# Patient Record
Sex: Female | Born: 1960 | Race: Black or African American | Hispanic: No | Smoking: Never smoker
Health system: Southern US, Community
[De-identification: ages and names within clinical notes are randomized; demographics above are authoritative.]

## PROBLEM LIST (undated history)

## (undated) DIAGNOSIS — I10 Essential (primary) hypertension: Secondary | ICD-10-CM

## (undated) DIAGNOSIS — M1990 Unspecified osteoarthritis, unspecified site: Secondary | ICD-10-CM

## (undated) HISTORY — PX: MENISCUS REPAIR: SHX5179

## (undated) HISTORY — PX: ABDOMINAL HYSTERECTOMY: SHX81

---

## 1997-09-18 ENCOUNTER — Other Ambulatory Visit: Admission: RE | Admit: 1997-09-18 | Discharge: 1997-09-18 | Payer: Self-pay | Admitting: Dermatology

## 1998-06-15 ENCOUNTER — Other Ambulatory Visit: Admission: RE | Admit: 1998-06-15 | Discharge: 1998-06-15 | Payer: Self-pay | Admitting: Obstetrics and Gynecology

## 1998-06-16 ENCOUNTER — Other Ambulatory Visit: Admission: RE | Admit: 1998-06-16 | Discharge: 1998-06-16 | Payer: Self-pay | Admitting: Obstetrics and Gynecology

## 2000-07-06 ENCOUNTER — Encounter: Payer: Self-pay | Admitting: Family Medicine

## 2000-07-06 ENCOUNTER — Encounter: Admission: RE | Admit: 2000-07-06 | Discharge: 2000-07-06 | Payer: Self-pay | Admitting: Family Medicine

## 2000-07-19 ENCOUNTER — Encounter: Admission: RE | Admit: 2000-07-19 | Discharge: 2000-07-19 | Payer: Self-pay | Admitting: Family Medicine

## 2000-07-19 ENCOUNTER — Encounter: Payer: Self-pay | Admitting: Family Medicine

## 2001-02-07 ENCOUNTER — Encounter: Payer: Self-pay | Admitting: Family Medicine

## 2001-02-07 ENCOUNTER — Encounter: Admission: RE | Admit: 2001-02-07 | Discharge: 2001-02-07 | Payer: Self-pay | Admitting: Family Medicine

## 2001-03-25 ENCOUNTER — Other Ambulatory Visit: Admission: RE | Admit: 2001-03-25 | Discharge: 2001-03-25 | Payer: Self-pay | Admitting: Family Medicine

## 2001-11-04 ENCOUNTER — Encounter: Payer: Self-pay | Admitting: Family Medicine

## 2001-11-04 ENCOUNTER — Encounter: Admission: RE | Admit: 2001-11-04 | Discharge: 2001-11-04 | Payer: Self-pay | Admitting: Family Medicine

## 2002-07-14 ENCOUNTER — Ambulatory Visit (HOSPITAL_COMMUNITY): Admission: RE | Admit: 2002-07-14 | Discharge: 2002-07-14 | Payer: Self-pay | Admitting: Family Medicine

## 2002-07-14 ENCOUNTER — Encounter: Payer: Self-pay | Admitting: Family Medicine

## 2002-11-24 ENCOUNTER — Encounter: Payer: Self-pay | Admitting: Family Medicine

## 2002-11-24 ENCOUNTER — Encounter: Admission: RE | Admit: 2002-11-24 | Discharge: 2002-11-24 | Payer: Self-pay | Admitting: Family Medicine

## 2004-02-04 ENCOUNTER — Encounter: Admission: RE | Admit: 2004-02-04 | Discharge: 2004-02-04 | Payer: Self-pay | Admitting: Family Medicine

## 2004-05-16 ENCOUNTER — Ambulatory Visit (HOSPITAL_COMMUNITY): Admission: RE | Admit: 2004-05-16 | Discharge: 2004-05-16 | Payer: Self-pay | Admitting: Family Medicine

## 2004-08-23 ENCOUNTER — Encounter: Admission: RE | Admit: 2004-08-23 | Discharge: 2004-08-23 | Payer: Self-pay | Admitting: Allergy and Immunology

## 2005-03-17 ENCOUNTER — Encounter: Admission: RE | Admit: 2005-03-17 | Discharge: 2005-03-17 | Payer: Self-pay | Admitting: Family Medicine

## 2006-09-27 ENCOUNTER — Encounter: Admission: RE | Admit: 2006-09-27 | Discharge: 2006-09-27 | Payer: Self-pay | Admitting: Family Medicine

## 2006-10-16 ENCOUNTER — Other Ambulatory Visit: Admission: RE | Admit: 2006-10-16 | Discharge: 2006-10-16 | Payer: Self-pay | Admitting: Family Medicine

## 2007-10-09 ENCOUNTER — Encounter: Admission: RE | Admit: 2007-10-09 | Discharge: 2007-10-09 | Payer: Self-pay | Admitting: Family Medicine

## 2008-05-03 ENCOUNTER — Emergency Department (HOSPITAL_COMMUNITY): Admission: EM | Admit: 2008-05-03 | Discharge: 2008-05-03 | Payer: Self-pay | Admitting: Emergency Medicine

## 2008-05-03 ENCOUNTER — Ambulatory Visit: Payer: Self-pay | Admitting: Vascular Surgery

## 2008-05-03 ENCOUNTER — Encounter (INDEPENDENT_AMBULATORY_CARE_PROVIDER_SITE_OTHER): Payer: Self-pay | Admitting: Emergency Medicine

## 2008-08-06 ENCOUNTER — Encounter: Admission: RE | Admit: 2008-08-06 | Discharge: 2008-08-06 | Payer: Self-pay | Admitting: Orthopedic Surgery

## 2009-01-05 ENCOUNTER — Encounter: Admission: RE | Admit: 2009-01-05 | Discharge: 2009-01-05 | Payer: Self-pay | Admitting: Family Medicine

## 2009-10-07 ENCOUNTER — Other Ambulatory Visit: Admission: RE | Admit: 2009-10-07 | Discharge: 2009-10-07 | Payer: Self-pay | Admitting: Family Medicine

## 2010-01-27 ENCOUNTER — Encounter
Admission: RE | Admit: 2010-01-27 | Discharge: 2010-01-27 | Payer: Self-pay | Source: Home / Self Care | Admitting: Family Medicine

## 2010-02-08 ENCOUNTER — Encounter
Admission: RE | Admit: 2010-02-08 | Discharge: 2010-02-08 | Payer: Self-pay | Source: Home / Self Care | Attending: Family Medicine | Admitting: Family Medicine

## 2010-03-05 IMAGING — CR DG SHOULDER 3+V*R*
2 series · 6 of 6 positions shown · non-contrast
Comparison: NONE

CLINICAL DATA: Attn. NODA, ANA JULIA  Pain. 

RIGHT SHOULDER ??? TWO VIEWS

[Series 1: view not recorded · 0.17mm/px · 2 of 2 slices shown (1 of 2)]
[im 1/2]
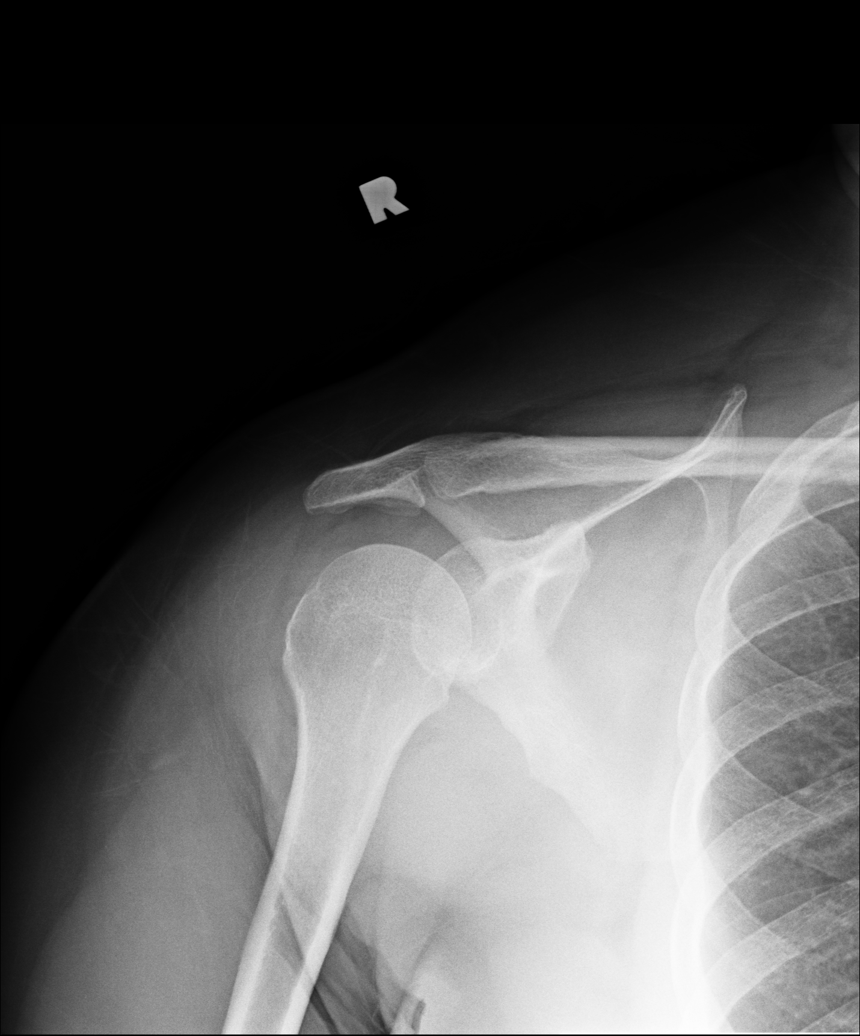
[im 2/2]
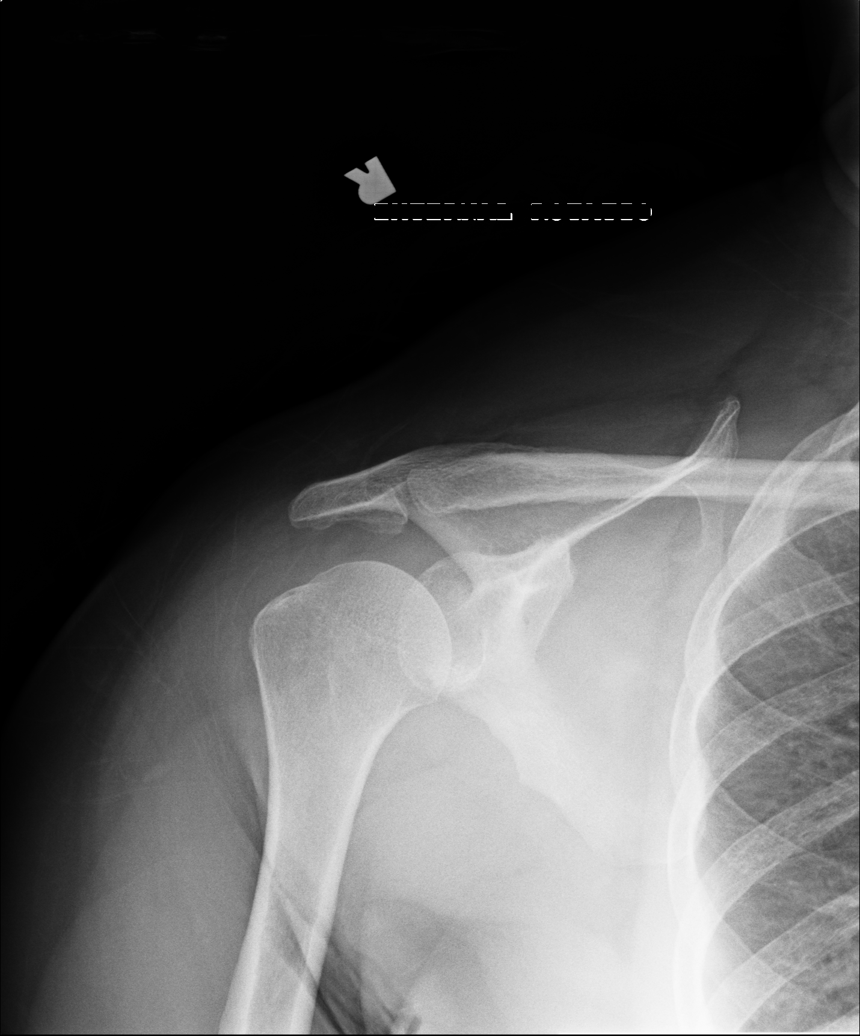

[Series 2: view not recorded · 0.17mm/px · 4 of 4 slices shown (2 of 2)]
[im 1/4]
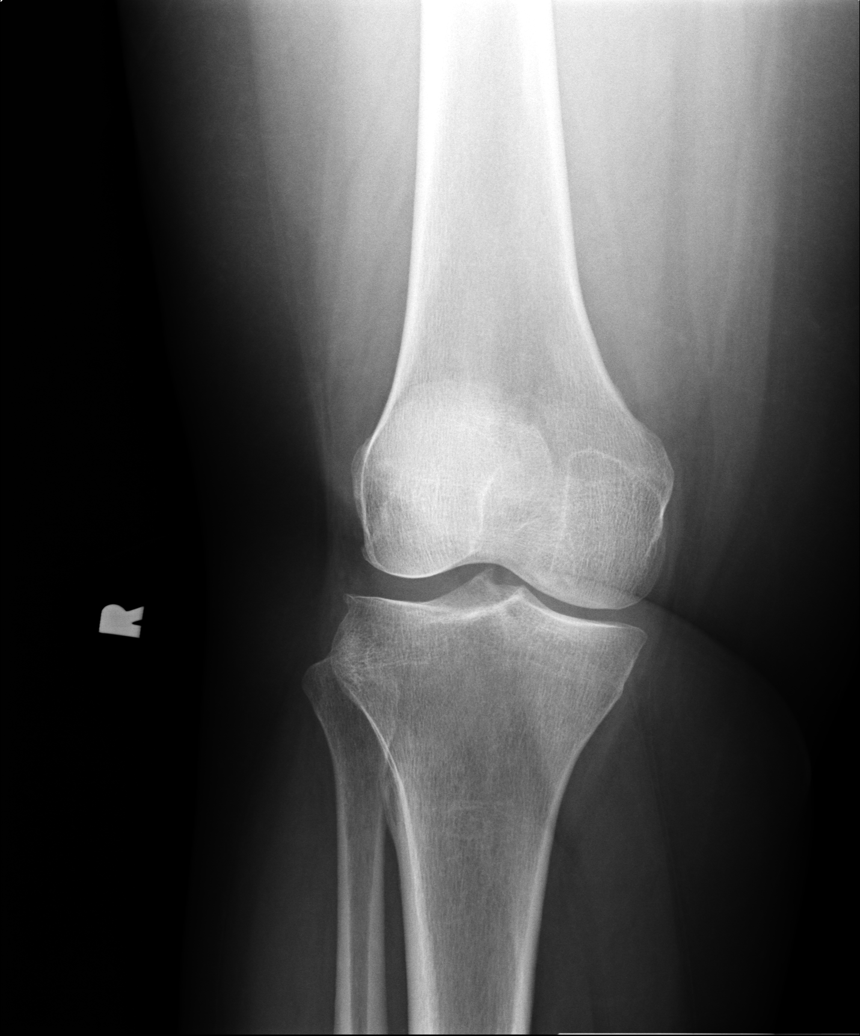
[im 2/4]
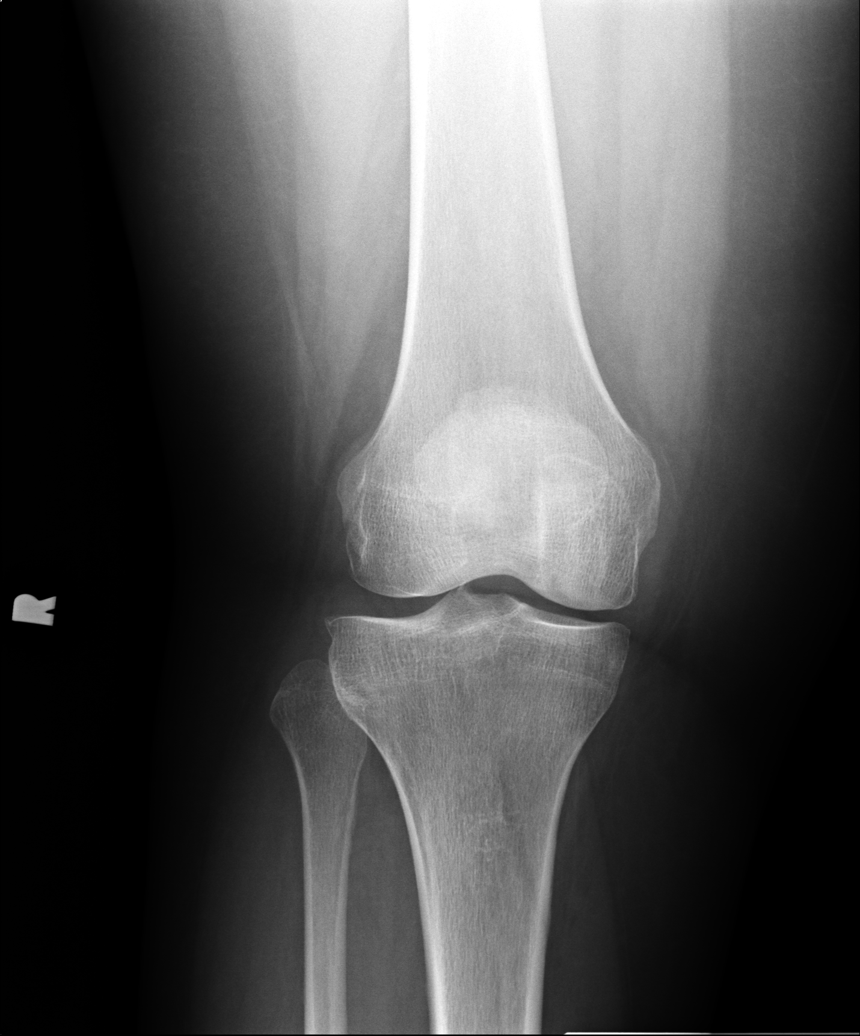
[im 3/4]
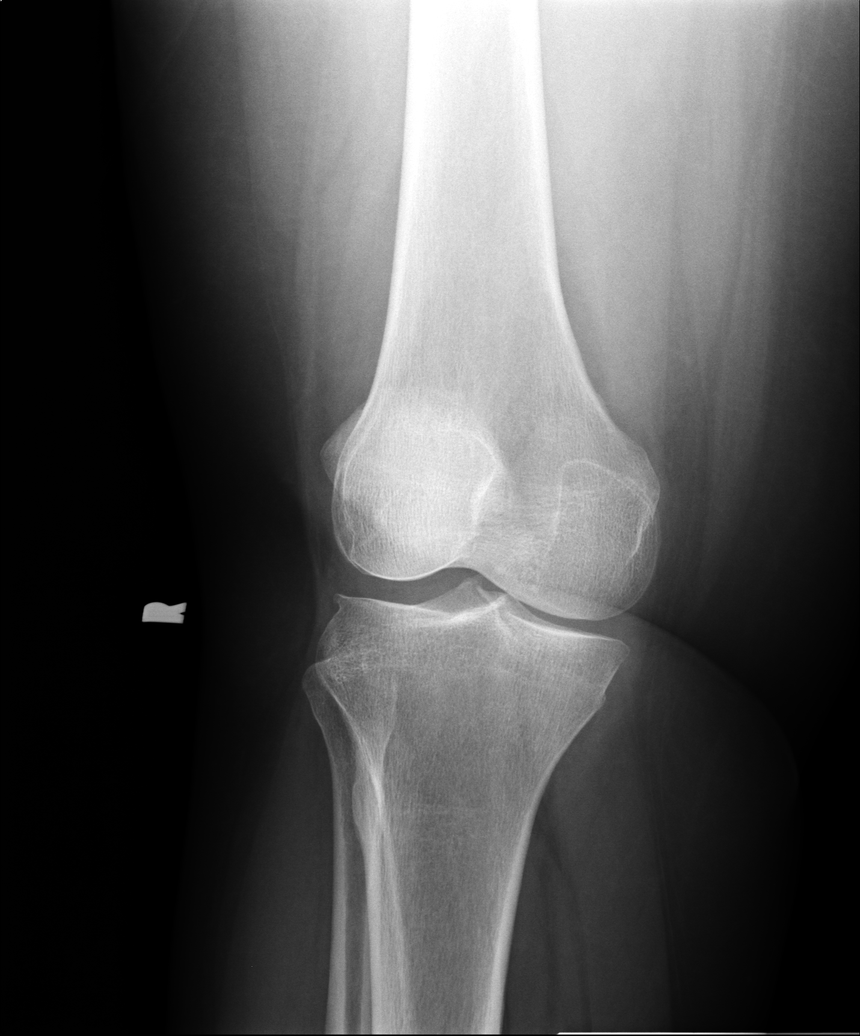
[im 4/4]
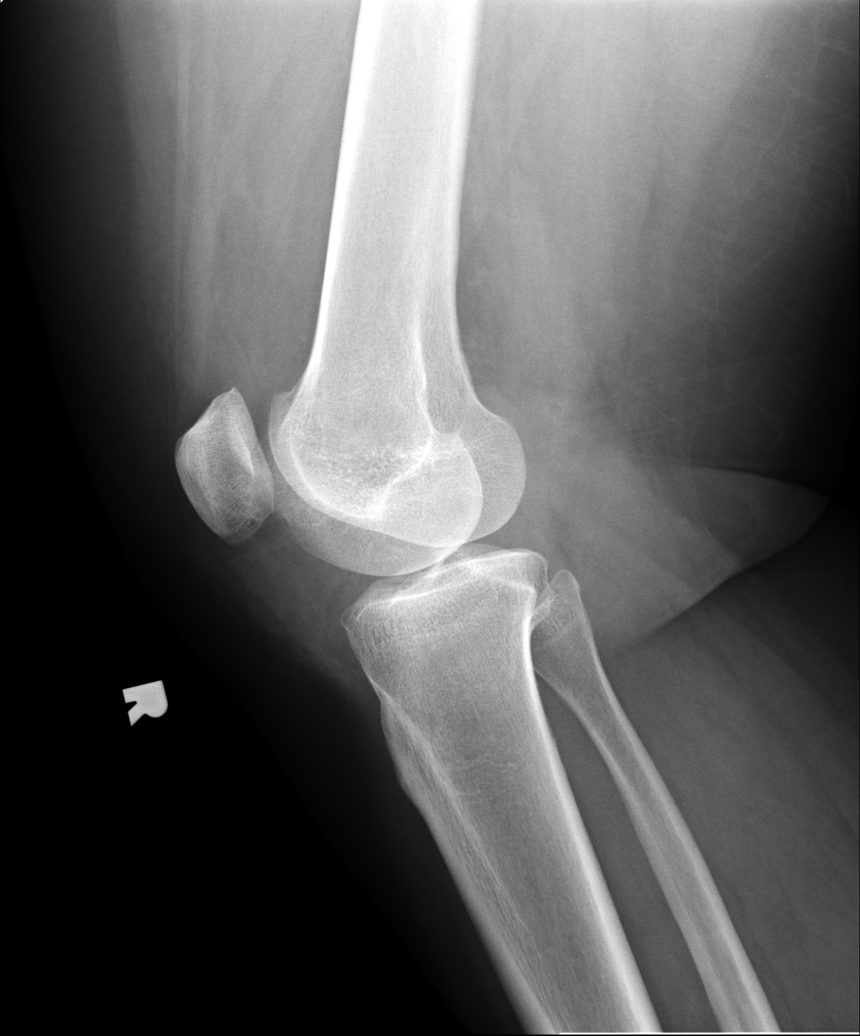

[6 of 6 positions shown; findings below may reference images not displayed]

FINDINGS: Views of the right shoulder demonstrate no evidence of 
fracture, dislocation, soft tissue abnormality, or changes 
suggesting erosive or degenerative arthritis. 

electronically reviewed on 05/13/2007 Dict Date: 05/13/2007  Tran 
Date:  05/13/2007 DAS  [REDACTED]

## 2011-01-10 ENCOUNTER — Other Ambulatory Visit: Payer: Self-pay | Admitting: Family Medicine

## 2011-01-10 DIAGNOSIS — Z1231 Encounter for screening mammogram for malignant neoplasm of breast: Secondary | ICD-10-CM

## 2011-02-10 ENCOUNTER — Ambulatory Visit
Admission: RE | Admit: 2011-02-10 | Discharge: 2011-02-10 | Disposition: A | Discharge status: Home / Self Care | Payer: BC Managed Care – PPO | Source: Ambulatory Visit | Attending: Family Medicine | Admitting: Family Medicine

## 2011-02-10 DIAGNOSIS — Z1231 Encounter for screening mammogram for malignant neoplasm of breast: Secondary | ICD-10-CM

## 2011-11-03 ENCOUNTER — Other Ambulatory Visit: Payer: Self-pay | Admitting: Occupational Medicine

## 2011-11-03 ENCOUNTER — Ambulatory Visit
Admission: RE | Admit: 2011-11-03 | Discharge: 2011-11-03 | Disposition: A | Discharge status: Home / Self Care | Payer: Self-pay | Source: Ambulatory Visit | Attending: Occupational Medicine | Admitting: Occupational Medicine

## 2011-11-03 DIAGNOSIS — W19XXXA Unspecified fall, initial encounter: Secondary | ICD-10-CM

## 2012-02-07 ENCOUNTER — Other Ambulatory Visit: Payer: Self-pay | Admitting: Family Medicine

## 2012-02-07 DIAGNOSIS — Z1231 Encounter for screening mammogram for malignant neoplasm of breast: Secondary | ICD-10-CM

## 2012-02-14 ENCOUNTER — Ambulatory Visit
Admission: RE | Admit: 2012-02-14 | Discharge: 2012-02-14 | Disposition: A | Discharge status: Home / Self Care | Payer: BC Managed Care – PPO | Source: Ambulatory Visit | Attending: Family Medicine | Admitting: Family Medicine

## 2012-02-14 DIAGNOSIS — Z1231 Encounter for screening mammogram for malignant neoplasm of breast: Secondary | ICD-10-CM

## 2012-07-30 ENCOUNTER — Ambulatory Visit: Payer: Self-pay

## 2012-07-30 ENCOUNTER — Other Ambulatory Visit: Payer: Self-pay | Admitting: Occupational Medicine

## 2012-07-30 DIAGNOSIS — R52 Pain, unspecified: Secondary | ICD-10-CM

## 2013-03-18 ENCOUNTER — Other Ambulatory Visit: Payer: Self-pay

## 2013-03-18 DIAGNOSIS — Z1231 Encounter for screening mammogram for malignant neoplasm of breast: Secondary | ICD-10-CM

## 2013-03-18 DIAGNOSIS — Z803 Family history of malignant neoplasm of breast: Secondary | ICD-10-CM

## 2013-03-19 ENCOUNTER — Ambulatory Visit
Admission: RE | Admit: 2013-03-19 | Discharge: 2013-03-19 | Disposition: A | Discharge status: Home / Self Care | Payer: BC Managed Care – PPO | Source: Ambulatory Visit

## 2013-03-19 DIAGNOSIS — Z803 Family history of malignant neoplasm of breast: Secondary | ICD-10-CM

## 2013-03-19 DIAGNOSIS — Z1231 Encounter for screening mammogram for malignant neoplasm of breast: Secondary | ICD-10-CM

## 2014-03-31 ENCOUNTER — Other Ambulatory Visit: Payer: Self-pay

## 2014-03-31 DIAGNOSIS — Z1231 Encounter for screening mammogram for malignant neoplasm of breast: Secondary | ICD-10-CM

## 2014-04-03 ENCOUNTER — Ambulatory Visit
Admission: RE | Admit: 2014-04-03 | Discharge: 2014-04-03 | Disposition: A | Discharge status: Home / Self Care | Payer: BC Managed Care – PPO | Source: Ambulatory Visit

## 2014-04-03 DIAGNOSIS — Z1231 Encounter for screening mammogram for malignant neoplasm of breast: Secondary | ICD-10-CM

## 2015-04-08 ENCOUNTER — Other Ambulatory Visit: Payer: Self-pay

## 2015-04-08 DIAGNOSIS — Z1231 Encounter for screening mammogram for malignant neoplasm of breast: Secondary | ICD-10-CM

## 2015-04-26 ENCOUNTER — Ambulatory Visit
Admission: RE | Admit: 2015-04-26 | Discharge: 2015-04-26 | Disposition: A | Discharge status: Home / Self Care | Payer: BC Managed Care – PPO | Source: Ambulatory Visit

## 2015-04-26 DIAGNOSIS — Z1231 Encounter for screening mammogram for malignant neoplasm of breast: Secondary | ICD-10-CM

## 2016-06-22 ENCOUNTER — Other Ambulatory Visit: Payer: Self-pay | Admitting: Family Medicine

## 2016-06-22 DIAGNOSIS — Z1231 Encounter for screening mammogram for malignant neoplasm of breast: Secondary | ICD-10-CM

## 2016-07-12 ENCOUNTER — Ambulatory Visit: Payer: Self-pay

## 2016-07-21 ENCOUNTER — Ambulatory Visit
Admission: RE | Admit: 2016-07-21 | Discharge: 2016-07-21 | Disposition: A | Discharge status: Home / Self Care | Payer: BC Managed Care – PPO | Source: Ambulatory Visit | Attending: Family Medicine | Admitting: Family Medicine

## 2016-07-21 DIAGNOSIS — Z1231 Encounter for screening mammogram for malignant neoplasm of breast: Secondary | ICD-10-CM

## 2017-08-31 ENCOUNTER — Other Ambulatory Visit: Payer: Self-pay | Admitting: Family Medicine

## 2017-12-06 ENCOUNTER — Other Ambulatory Visit: Payer: Self-pay | Admitting: Family Medicine

## 2017-12-06 DIAGNOSIS — Z1231 Encounter for screening mammogram for malignant neoplasm of breast: Secondary | ICD-10-CM

## 2018-01-11 ENCOUNTER — Ambulatory Visit
Admission: RE | Admit: 2018-01-11 | Discharge: 2018-01-11 | Disposition: A | Discharge status: Home / Self Care | Payer: BC Managed Care – PPO | Source: Ambulatory Visit | Attending: Family Medicine | Admitting: Family Medicine

## 2018-01-11 DIAGNOSIS — Z1231 Encounter for screening mammogram for malignant neoplasm of breast: Secondary | ICD-10-CM

## 2018-12-18 ENCOUNTER — Other Ambulatory Visit: Payer: Self-pay | Admitting: Family Medicine

## 2018-12-18 DIAGNOSIS — Z1231 Encounter for screening mammogram for malignant neoplasm of breast: Secondary | ICD-10-CM

## 2019-02-05 ENCOUNTER — Other Ambulatory Visit: Payer: Self-pay

## 2019-02-05 ENCOUNTER — Ambulatory Visit
Admission: RE | Admit: 2019-02-05 | Discharge: 2019-02-05 | Disposition: A | Discharge status: Home / Self Care | Payer: BC Managed Care – PPO | Source: Ambulatory Visit | Attending: Family Medicine | Admitting: Family Medicine

## 2019-02-05 DIAGNOSIS — Z1231 Encounter for screening mammogram for malignant neoplasm of breast: Secondary | ICD-10-CM

## 2019-04-13 ENCOUNTER — Ambulatory Visit: Payer: BC Managed Care – PPO

## 2019-04-24 ENCOUNTER — Ambulatory Visit: Payer: BC Managed Care – PPO | Attending: Internal Medicine

## 2019-04-24 DIAGNOSIS — Z23 Encounter for immunization: Secondary | ICD-10-CM | POA: Insufficient documentation

## 2019-04-24 NOTE — Progress Notes (Signed)
   Covid-19 Vaccination Clinic  Name:  Crystal Ramirez    MRN: [id number] DOB: April 05, 1960  04/24/2019  [name] was observed post Covid-19 immunization for 15 minutes without incidence. She was provided with Vaccine Information Sheet and instruction to access the V-Safe system.   [name] was instructed to call 911 with any severe reactions post vaccine: [name] Kitchen Difficulty breathing  . Swelling of your face and throat  . A fast heartbeat  . A bad rash all over your body  . Dizziness and weakness    Immunizations Administered    Name Date Dose VIS Date Route   Pfizer COVID-19 Vaccine 04/24/2019 12:24 PM 0.3 mL 02/07/2019 Intramuscular   Manufacturer: [employer], [employer]   Lot: [id number]   NDC: [address]-9

## 2019-05-02 ENCOUNTER — Ambulatory Visit
Admission: RE | Admit: 2019-05-02 | Discharge: 2019-05-02 | Disposition: A | Discharge status: Home / Self Care | Payer: BC Managed Care – PPO | Source: Ambulatory Visit | Attending: Family Medicine | Admitting: Family Medicine

## 2019-05-02 ENCOUNTER — Other Ambulatory Visit: Payer: Self-pay | Admitting: Family Medicine

## 2019-05-02 ENCOUNTER — Other Ambulatory Visit: Payer: Self-pay

## 2019-05-02 DIAGNOSIS — M25552 Pain in left hip: Secondary | ICD-10-CM

## 2019-05-20 ENCOUNTER — Ambulatory Visit: Payer: BC Managed Care – PPO | Attending: Internal Medicine

## 2019-05-20 DIAGNOSIS — Z23 Encounter for immunization: Secondary | ICD-10-CM

## 2019-05-20 NOTE — Progress Notes (Signed)
   Covid-19 Vaccination Clinic  Name:  Crystal Ramirez    MRN: [id number] DOB: 12-17-1960  05/20/2019  [name] was observed post Covid-19 immunization for 15 minutes without incident. She was provided with Vaccine Information Sheet and instruction to access the V-Safe system.   [name] was instructed to call 911 with any severe reactions post vaccine: [name] Kitchen Difficulty breathing  . Swelling of face and throat  . A fast heartbeat  . A bad rash all over body  . Dizziness and weakness   Immunizations Administered    Name Date Dose VIS Date Route   Pfizer COVID-19 Vaccine 05/20/2019 12:13 PM 0.3 mL 02/07/2019 Intramuscular   Manufacturer: [employer], [employer]   Lot: [id number]   NDC: [address]-2

## 2020-05-28 NOTE — H&P (Signed)
HIP ARTHROPLASTY ADMISSION H&P  Patient ID: Le Ferraz MRN: [id number] DOB/AGE: [birth year] 61 y.o.  Chief Complaint: left hip pain.  Planned Procedure Date: 06-08-20 Medical Clearance by Roslynn Amble PA-C     HPI: Crystal Ramirez is a 60 y.o. female who presents for evaluation of djd left hip. The patient has a history of pain and functional disability in the left hip due to arthritis and has failed non-surgical conservative treatments for greater than 12 weeks to include NSAID's and/or analgesics, corticosteriod injections, use of assistive devices, weight reduction as appropriate and activity modification.  Onset of symptoms was gradual, starting 1 years ago with rapidlly worsening course since that time. The patient noted no past surgery on the left hip.  Patient currently rates pain at 6 out of 10 with activity. Patient has night pain, worsening of pain with activity and weight bearing and pain that interferes with activities of daily living.  Patient has evidence of subchondral sclerosis and joint space narrowing by imaging studies.  There is no active infection.  No past medical history on file. No past surgical history on file. Allergies  Allergen Reactions  . Latex Shortness Of Breath  . Ibuprofen     High strengths cause acid reflux  . Morphine And Related     whelps    Prior to Admission medications   Medication Sig Start Date End Date Taking? Authorizing Provider  amLODipine (NORVASC) 5 MG tablet Take 5 mg by mouth daily.   Yes [provider]  aspirin EC 81 MG tablet Take 81 mg by mouth once a week. Swallow whole.   Yes [provider]  cetirizine (ZYRTEC) 10 MG tablet Take 10 mg by mouth daily as needed for allergies.   Yes [provider]  fluticasone (FLONASE) 50 MCG/ACT nasal spray Place 1 spray into both nostrils daily as needed for allergies. 05/07/20  Yes [provider]  gabapentin (NEURONTIN) 100 MG  capsule Take 100 mg by mouth daily as needed (pain).   Yes [provider]  ketotifen (ALLERGY EYE DROPS) 0.025 % ophthalmic solution Place 1 drop into both eyes daily.   Yes [provider]  losartan-hydrochlorothiazide (HYZAAR) 100-25 MG tablet Take 1 tablet by mouth daily.   Yes [provider]  meloxicam (MOBIC) 15 MG tablet Take 15 mg by mouth daily.   Yes [provider]  metoprolol succinate (TOPROL-XL) 100 MG 24 hr tablet Take 100 mg by mouth daily. Take with or immediately following a meal.   Yes [provider]  Multiple Vitamin (MULTIVITAMIN WITH MINERALS) TABS tablet Take 1 tablet by mouth daily.   Yes [provider]  phentermine 15 MG capsule Take 15 mg by mouth daily.   Yes [provider]  potassium chloride (KLOR-CON) 10 MEQ tablet Take 10 mEq by mouth daily.   Yes [provider]   Social History   Socioeconomic History  . Marital status: Legally Separated    Spouse name: Not on file  . Number of children: Not on file  . Years of education: Not on file  . Highest education level: Not on file  Occupational History  . Not on file  Tobacco Use  . Smoking status: Not on file  . Smokeless tobacco: Not on file  Substance and Sexual Activity  . Alcohol use: Not on file  . Drug use: Not on file  . Sexual activity: Not on file  Other Topics Concern  . Not on file  Social History Narrative  . Not on file   Social Determinants of Health   Financial Resource Strain: Not on file  Food Insecurity: Not on file  Transportation Needs: Not on file  Physical Activity: Not on file  Stress: Not on file  Social Connections: Not on file   No family history on file.  ROS: Currently denies lightheadedness, dizziness, Fever, chills, CP, SOB.   No personal history of DVT, PE, MI, or CVA. No loose teeth or dentures All other systems have been reviewed and were otherwise currently negative with the exception of  those mentioned in the HPI and as above.  Objective: Vitals: Ht: 5'6" Wt: 240.4 lbs Temp: 97.9 BP: 129/80 Pulse: 56 O2 99% on room air.   Physical Exam: General: Alert, NAD. Trendelenberg Gait. Very slow ambulation. Uses a cane HEENT: EOMI, Good Neck Extension  Pulm: No increased work of breathing.  Clear B/L A/P w/o crackle or wheeze.  CV: RRR, No m/g/r appreciated  GI: soft, NT, ND. BS in all 4 quadrants Neuro: CN II-XII grossly intact without focal deficit.  Sensation intact distally Skin: No lesions in the area of chief complaint MSK/Surgical Site: left Hip pain with passive ROM. + Stinchfield. + FABER. + FADIR. Decreased strength.  NVI.    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   The bone quality appears to be fair for age and reported activity level.  Preoperative templating of the joint replacement has been completed, documented, and submitted to the Operating Room personnel in order to optimize intra-operative equipment management.  Assessment: djd left hip Active Problems:   * No active hospital problems. *   Plan: Plan for Procedure(s): TOTAL HIP ARTHROPLASTY  The patient history, physical exam, clinical judgement of the provider and imaging are consistent with end stage degenerative joint disease and total joint arthroplasty is deemed medically necessary. The treatment options including medical management, injection therapy, and arthroplasty were discussed at length. The risks and benefits of Procedure(s): TOTAL HIP ARTHROPLASTY were presented and reviewed.  The risks of nonoperative treatment, versus surgical intervention including but not limited to continued pain, aseptic loosening, stiffness, dislocation/subluxation, infection, bleeding, nerve injury, blood clots, cardiopulmonary complications, morbidity, mortality, among others were discussed. The patient verbalizes understanding and wishes to proceed with the  plan.  Patient is being admitted for surgery, pain control, PT, prophylactic antibiotics, VTE prophylaxis, progressive ambulation, ADL's and discharge planning.   Dental prophylaxis discussed and recommended for 2 years postoperatively.   The patient does meet the criteria for TXA which will be used perioperatively.    ASA 81 mg BID will be used postoperatively for DVT prophylaxis in addition to SCDs, and early ambulation.  Plan for Norco, Mobic, Tylenol for pain. She would like to avoid Oxycodone if possible.    Robaxin for muscle spasms.   Zofran for nausea and vomiting.  Omeprazole for gastric protection.  The patient is planning to be discharged home with OPPT into the care of her daughter Enrique Sack who can be reached at [phone].   Follow up appt 06-16-20 at 2:45pm  Lab work done.  Needs a rolling walker if not able to get one on her own.    Jenne Pane, [place] Office [phone] 05/28/2020 4:21 PM

## 2020-06-01 NOTE — Patient Instructions (Addendum)
DUE TO COVID-19 ONLY ONE VISITOR IS ALLOWED TO COME WITH YOU AND STAY IN THE WAITING ROOM ONLY DURING PRE OP AND PROCEDURE DAY OF SURGERY. THE 1 VISITOR  MAY VISIT WITH YOU AFTER SURGERY IN YOUR PRIVATE ROOM DURING VISITING HOURS ONLY!  YOU NEED TO HAVE A COVID 19 TEST ON: 06/04/20 @ 12:00 PM , THIS TEST MUST BE DONE BEFORE SURGERY,  COVID TESTING SITE [address] [place] [address], IT IS ON THE RIGHT GOING OUT [address] APPROXIMATELY  2 MINUTES PAST [employer] ON THE RIGHT. ONCE YOUR COVID TEST IS COMPLETED,  PLEASE BEGIN THE QUARANTINE INSTRUCTIONS AS OUTLINED IN YOUR HANDOUT.                Crystal Ramirez    Your procedure is scheduled on: 06/08/20   Report to [facility] Main  Entrance   Report to admitting at: 10:10 AM     Call this number if you have problems the morning of surgery [phone]    Remember:   NO SOLID FOOD AFTER MIDNIGHT THE NIGHT PRIOR TO SURGERY. NOTHING BY MOUTH EXCEPT CLEAR LIQUIDS UNTIL: 9:40AM . PLEASE FINISH ENSURE DRINK PER SURGEON ORDER  WHICH NEEDS TO BE COMPLETED AT: 9:40 AM .  CLEAR LIQUID DIET  Foods Allowed                                                                     Foods Excluded  Coffee and tea, regular and decaf                             liquids that you cannot  Plain Jell-O any favor except red or purple                                           see through such as: Fruit ices (not with fruit pulp)                                     milk, soups, orange juice  Iced Popsicles                                    All solid food Carbonated beverages, regular and diet                                    Cranberry, grape and apple juices Sports drinks like Gatorade Lightly seasoned clear broth or consume(fat free) Sugar, honey syrup  Sample Menu Breakfast                                Lunch  Supper Cranberry juice                    Beef broth                             Chicken broth Jell-O                                     Grape juice                           Apple juice Coffee or tea                        Jell-O                                      Popsicle                                                Coffee or tea                        Coffee or tea  _____________________________________________________________________   BRUSH YOUR TEETH MORNING OF SURGERY AND RINSE YOUR MOUTH OUT, NO CHEWING GUM CANDY OR MINTS.    Take these medicines the morning of surgery with A SIP OF WATER: amlodipine,metoprolol.                               You may not have any metal on your body including hair pins and              piercings  Do not wear jewelry, make-up, lotions, powders or perfumes, deodorant             Do not wear nail polish on your fingernails.  Do not shave  48 hours prior to surgery.    Do not bring valuables to the hospital.  IS NOT             RESPONSIBLE   FOR VALUABLES.  Contacts, dentures or bridgework may not be worn into surgery.  Leave suitcase in the car. After surgery it may be brought to your room.     Patients discharged the day of surgery will not be allowed to drive home. IF YOU ARE HAVING SURGERY AND GOING HOME THE SAME DAY, YOU MUST HAVE AN ADULT TO DRIVE YOU HOME AND BE WITH YOU FOR 24 HOURS. YOU MAY GO HOME BY TAXI OR UBER OR ORTHERWISE, BUT AN ADULT MUST ACCOMPANY YOU HOME AND STAY WITH YOU FOR 24 HOURS.  Name and phone number of your driver:  Special Instructions: N/A              Please read over the following fact sheets you were given: _____________________________________________________________________         [facility] - Preparing for Surgery Before surgery, you can play an important role.  Because skin is not sterile, your skin needs to be as free of germs as possible.  You can  reduce the number of germs on your skin by washing with CHG (chlorahexidine gluconate) soap before surgery.  CHG  is an antiseptic cleaner which kills germs and bonds with the skin to continue killing germs even after washing. Please DO NOT use if you have an allergy to CHG or antibacterial soaps.  If your skin becomes reddened/irritated stop using the CHG and inform your nurse when you arrive at Short Stay. Do not shave (including legs and underarms) for at least 48 hours prior to the first CHG shower.  You may shave your face/neck. Please follow these instructions carefully:  1.  Shower with CHG Soap the night before surgery and the  morning of Surgery.  2.  If you choose to wash your hair, wash your hair first as usual with your  normal  shampoo.  3.  After you shampoo, rinse your hair and body thoroughly to remove the  shampoo.                           4.  Use CHG as you would any other liquid soap.  You can apply chg directly  to the skin and wash                       Gently with a scrungie or clean washcloth.  5.  Apply the CHG Soap to your body ONLY FROM THE NECK DOWN.   Do not use on face/ open                           Wound or open sores. Avoid contact with eyes, ears mouth and genitals (private parts).                       Wash face,  Genitals (private parts) with your normal soap.             6.  Wash thoroughly, paying special attention to the area where your surgery  will be performed.  7.  Thoroughly rinse your body with warm water from the neck down.  8.  DO NOT shower/wash with your normal soap after using and rinsing off  the CHG Soap.                9.  Pat yourself dry with a clean towel.            10.  Wear clean pajamas.            11.  Place clean sheets on your bed the night of your first shower and do not  sleep with pets. Day of Surgery : Do not apply any lotions/deodorants the morning of surgery.  Please wear clean clothes to the hospital/surgery center.  FAILURE TO FOLLOW THESE INSTRUCTIONS MAY RESULT IN THE CANCELLATION OF YOUR SURGERY PATIENT  SIGNATURE_________________________________  NURSE SIGNATURE__________________________________  ________________________________________________________________________   [name]  An incentive spirometer is a tool that can help keep your lungs clear and active. This tool measures how well you are filling your lungs with each breath. Taking long deep breaths may help reverse or decrease the chance of developing breathing (pulmonary) problems (especially infection) following:  A long period of time when you are unable to move or be active. BEFORE THE PROCEDURE   If the spirometer includes an indicator to show your best effort, your nurse or respiratory therapist will set it  to a desired goal.  If possible, sit up straight or lean slightly forward. Try not to slouch.  Hold the incentive spirometer in an upright position. INSTRUCTIONS FOR USE  1. Sit on the edge of your bed if possible, or sit up as far as you can in bed or on a chair. 2. Hold the incentive spirometer in an upright position. 3. Breathe out normally. 4. Place the mouthpiece in your mouth and seal your lips tightly around it. 5. Breathe in slowly and as deeply as possible, raising the piston or the ball toward the top of the column. 6. Hold your breath for 3-5 seconds or for as long as possible. Allow the piston or ball to fall to the bottom of the column. 7. Remove the mouthpiece from your mouth and breathe out normally. 8. Rest for a few seconds and repeat Steps 1 through 7 at least 10 times every 1-2 hours when you are awake. Take your time and take a few normal breaths between deep breaths. 9. The spirometer may include an indicator to show your best effort. Use the indicator as a goal to work toward during each repetition. 10. After each set of 10 deep breaths, practice coughing to be sure your lungs are clear. If you have an incision (the cut made at the time of surgery), support your incision when coughing  by placing a pillow or rolled up towels firmly against it. Once you are able to get out of bed, walk around indoors and cough well. You may stop using the incentive spirometer when instructed by your caregiver.  RISKS AND COMPLICATIONS  Take your time so you do not get dizzy or light-headed.  If you are in pain, you may need to take or ask for pain medication before doing incentive spirometry. It is harder to take a deep breath if you are having pain. AFTER USE  Rest and breathe slowly and easily.  It can be helpful to keep track of a log of your progress. Your caregiver can provide you with a simple table to help with this. If you are using the spirometer at home, follow these instructions: [place] IF:   You are having difficultly using the spirometer.  You have trouble using the spirometer as often as instructed.  Your pain medication is not giving enough relief while using the spirometer.  You develop fever of 100.5 F (38.1 C) or higher. SEEK IMMEDIATE MEDICAL CARE IF:   You cough up bloody sputum that had not been present before.  You develop fever of 102 F (38.9 C) or greater.  You develop worsening pain at or near the incision site. MAKE SURE YOU:   Understand these instructions.  Will watch your condition.  Will get help right away if you are not doing well or get worse. Document Released: 06/26/2006 Document Revised: 05/08/2011 Document Reviewed: 08/27/2006 [facility] Patient Information 2014 [place], [place].   ________________________________________________________________________

## 2020-06-02 ENCOUNTER — Encounter (HOSPITAL_COMMUNITY)
Admission: RE | Admit: 2020-06-02 | Discharge: 2020-06-02 | Disposition: A | Discharge status: Home / Self Care | Payer: BC Managed Care – PPO | Source: Ambulatory Visit | Attending: Orthopedic Surgery | Admitting: Orthopedic Surgery

## 2020-06-02 ENCOUNTER — Encounter (HOSPITAL_COMMUNITY): Payer: Self-pay

## 2020-06-02 ENCOUNTER — Other Ambulatory Visit: Payer: Self-pay

## 2020-06-02 DIAGNOSIS — Z01818 Encounter for other preprocedural examination: Secondary | ICD-10-CM | POA: Diagnosis not present

## 2020-06-02 HISTORY — DX: Unspecified osteoarthritis, unspecified site: M19.90

## 2020-06-02 HISTORY — DX: Essential (primary) hypertension: I10

## 2020-06-02 LAB — CBC
HCT: 36.9 % (ref 36.0–46.0)
Hemoglobin: 12.1 g/dL (ref 12.0–15.0)
MCH: 31.5 pg (ref 26.0–34.0)
MCHC: 32.8 g/dL (ref 30.0–36.0)
MCV: 96.1 fL (ref 80.0–100.0)
Platelets: 445 10*3/uL — ABNORMAL HIGH (ref 150–400)
RBC: 3.84 MIL/uL — ABNORMAL LOW (ref 3.87–5.11)
RDW: 12.7 % (ref 11.5–15.5)
WBC: 6.6 10*3/uL (ref 4.0–10.5)
nRBC: 0 % (ref 0.0–0.2)

## 2020-06-02 LAB — BASIC METABOLIC PANEL
Anion gap: 9 (ref 5–15)
BUN: 17 mg/dL (ref 8–23)
CO2: 27 mmol/L (ref 22–32)
Calcium: 9.3 mg/dL (ref 8.9–10.3)
Chloride: 103 mmol/L (ref 98–111)
Creatinine, Ser: 0.63 mg/dL (ref 0.44–1.00)
GFR, Estimated: >60 mL/min (ref >60)
Glucose, Bld: 98 mg/dL (ref 70–99)
Potassium: 3.3 mmol/L — ABNORMAL LOW (ref 3.5–5.1)
Sodium: 139 mmol/L (ref 135–145)

## 2020-06-02 LAB — SURGICAL PCR SCREEN
MRSA, PCR: NEGATIVE
Staphylococcus aureus: NEGATIVE

## 2020-06-02 NOTE — Progress Notes (Signed)
COVID Vaccine Completed: Yes Date COVID Vaccine completed: 05/20/19 COVID vaccine manufacturer: Pfizer      PCP - Dr. Severiano Gilbert Cardiologist -   Chest x-ray -  EKG -  Stress Test -  ECHO -  Cardiac Cath -  Pacemaker/ICD device last checked:  Sleep Study -  CPAP -   Fasting Blood Sugar -  Checks Blood Sugar _____ times a day  Blood Thinner Instructions: Aspirin Instructions: Last Dose:  Anesthesia review:   Patient denies shortness of breath, fever, cough and chest pain at PAT appointment   Patient verbalized understanding of instructions that were given to them at the PAT appointment. Patient was also instructed that they will need to review over the PAT instructions again at home before surgery.

## 2020-06-04 ENCOUNTER — Other Ambulatory Visit (HOSPITAL_COMMUNITY)
Admission: RE | Admit: 2020-06-04 | Discharge: 2020-06-04 | Disposition: A | Discharge status: Home / Self Care | Payer: BC Managed Care – PPO | Source: Ambulatory Visit | Attending: Orthopedic Surgery | Admitting: Orthopedic Surgery

## 2020-06-04 DIAGNOSIS — Z01812 Encounter for preprocedural laboratory examination: Secondary | ICD-10-CM | POA: Insufficient documentation

## 2020-06-04 DIAGNOSIS — Z20822 Contact with and (suspected) exposure to covid-19: Secondary | ICD-10-CM | POA: Insufficient documentation

## 2020-06-04 LAB — SARS CORONAVIRUS 2 (TAT 6-24 HRS): SARS Coronavirus 2: NEGATIVE

## 2020-06-08 ENCOUNTER — Ambulatory Visit (HOSPITAL_COMMUNITY): Payer: BC Managed Care – PPO | Admitting: Anesthesiology

## 2020-06-08 ENCOUNTER — Observation Stay (HOSPITAL_COMMUNITY)
Admission: RE | Admit: 2020-06-08 | Discharge: 2020-06-09 | Disposition: A | Discharge status: Home / Self Care | Payer: BC Managed Care – PPO | Source: Ambulatory Visit | Attending: Orthopedic Surgery | Admitting: Orthopedic Surgery

## 2020-06-08 ENCOUNTER — Encounter (HOSPITAL_COMMUNITY): Payer: Self-pay | Admitting: Orthopedic Surgery

## 2020-06-08 ENCOUNTER — Ambulatory Visit (HOSPITAL_COMMUNITY): Payer: BC Managed Care – PPO

## 2020-06-08 ENCOUNTER — Other Ambulatory Visit: Payer: Self-pay

## 2020-06-08 ENCOUNTER — Encounter (HOSPITAL_COMMUNITY)
Admission: RE | Disposition: A | Discharge status: Home / Self Care | Payer: Self-pay | Source: Ambulatory Visit | Attending: Orthopedic Surgery

## 2020-06-08 DIAGNOSIS — M25552 Pain in left hip: Secondary | ICD-10-CM | POA: Diagnosis present

## 2020-06-08 DIAGNOSIS — Z96642 Presence of left artificial hip joint: Secondary | ICD-10-CM | POA: Diagnosis present

## 2020-06-08 DIAGNOSIS — Z91040 Latex allergy status: Secondary | ICD-10-CM | POA: Insufficient documentation

## 2020-06-08 DIAGNOSIS — Z791 Long term (current) use of non-steroidal anti-inflammatories (NSAID): Secondary | ICD-10-CM | POA: Diagnosis not present

## 2020-06-08 DIAGNOSIS — Z419 Encounter for procedure for purposes other than remedying health state, unspecified: Secondary | ICD-10-CM

## 2020-06-08 DIAGNOSIS — M1612 Unilateral primary osteoarthritis, left hip: Principal | ICD-10-CM | POA: Insufficient documentation

## 2020-06-08 DIAGNOSIS — Z7952 Long term (current) use of systemic steroids: Secondary | ICD-10-CM | POA: Insufficient documentation

## 2020-06-08 HISTORY — PX: TOTAL HIP ARTHROPLASTY: SHX124

## 2020-06-08 SURGERY — ARTHROPLASTY, HIP, TOTAL, ANTERIOR APPROACH
Anesthesia: Spinal | Site: Hip | Laterality: Left

## 2020-06-08 MED ORDER — OXYCODONE HCL 5 MG PO TABS
5.0000 mg | ORAL_TABLET | ORAL | Status: DC | PRN
  Administered 2020-06-09: 10 mg via ORAL
  Filled 2020-06-08 (×3): qty 2

## 2020-06-08 MED ORDER — ONDANSETRON HCL 4 MG PO TABS: 4.0000 mg | ORAL_TABLET | Freq: Four times a day (QID) | ORAL | Status: DC | PRN

## 2020-06-08 MED ORDER — PROPOFOL 500 MG/50ML IV EMUL
INTRAVENOUS | Status: AC
  Filled 2020-06-08: qty 50

## 2020-06-08 MED ORDER — CEFAZOLIN SODIUM-DEXTROSE 2-4 GM/100ML-% IV SOLN
2.0000 g | Freq: Four times a day (QID) | INTRAVENOUS | Status: AC
  Administered 2020-06-08 – 2020-06-09 (×2): 2 g via INTRAVENOUS
  Filled 2020-06-08 (×2): qty 100

## 2020-06-08 MED ORDER — LACTATED RINGERS IV SOLN: INTRAVENOUS | Status: DC

## 2020-06-08 MED ORDER — PHENYLEPHRINE 40 MCG/ML (10ML) SYRINGE FOR IV PUSH (FOR BLOOD PRESSURE SUPPORT)
PREFILLED_SYRINGE | INTRAVENOUS | Status: DC | PRN
  Administered 2020-06-08: 80 ug via INTRAVENOUS
  Administered 2020-06-08 (×2): 120 ug via INTRAVENOUS

## 2020-06-08 MED ORDER — ALUM & MAG HYDROXIDE-SIMETH 200-200-20 MG/5ML PO SUSP: 30.0000 mL | ORAL | Status: DC | PRN

## 2020-06-08 MED ORDER — MIDAZOLAM HCL 2 MG/2ML IJ SOLN
INTRAMUSCULAR | Status: AC
  Filled 2020-06-08: qty 2

## 2020-06-08 MED ORDER — WATER FOR IRRIGATION, STERILE IR SOLN
Status: DC | PRN
  Administered 2020-06-08: 2000 mL

## 2020-06-08 MED ORDER — SODIUM CHLORIDE FLUSH 0.9 % IV SOLN
INTRAVENOUS | Status: DC | PRN
Start: 2020-06-08 — End: 2020-06-08
  Administered 2020-06-08: 20 mL

## 2020-06-08 MED ORDER — PHENYLEPHRINE 40 MCG/ML (10ML) SYRINGE FOR IV PUSH (FOR BLOOD PRESSURE SUPPORT)
PREFILLED_SYRINGE | INTRAVENOUS | Status: AC
  Filled 2020-06-08: qty 10

## 2020-06-08 MED ORDER — ACETAMINOPHEN 500 MG PO TABS
1000.0000 mg | ORAL_TABLET | Freq: Once | ORAL | Status: AC
  Administered 2020-06-08: 1000 mg via ORAL
  Filled 2020-06-08: qty 2

## 2020-06-08 MED ORDER — MENTHOL 3 MG MT LOZG: 1.0000 | LOZENGE | OROMUCOSAL | Status: DC | PRN

## 2020-06-08 MED ORDER — PHENYLEPHRINE HCL-NACL 10-0.9 MG/250ML-% IV SOLN
INTRAVENOUS | Status: DC | PRN
  Administered 2020-06-08: 40 ug/min via INTRAVENOUS

## 2020-06-08 MED ORDER — MAGNESIUM CITRATE PO SOLN: 1.0000 | Freq: Once | ORAL | Status: DC | PRN

## 2020-06-08 MED ORDER — ACETAMINOPHEN 325 MG PO TABS
325.0000 mg | ORAL_TABLET | Freq: Four times a day (QID) | ORAL | Status: DC | PRN
Start: 2020-06-09 — End: 2020-06-09

## 2020-06-08 MED ORDER — FENTANYL CITRATE (PF) 100 MCG/2ML IJ SOLN
INTRAMUSCULAR | Status: DC | PRN
  Administered 2020-06-08: 50 ug via INTRAVENOUS

## 2020-06-08 MED ORDER — OXYCODONE HCL 5 MG/5ML PO SOLN
5.0000 mg | Freq: Once | ORAL | Status: DC | PRN
Start: 2020-06-08 — End: 2020-06-08

## 2020-06-08 MED ORDER — DEXAMETHASONE SODIUM PHOSPHATE 10 MG/ML IJ SOLN
10.0000 mg | Freq: Once | INTRAMUSCULAR | Status: AC
  Administered 2020-06-09: 10 mg via INTRAVENOUS
  Filled 2020-06-08: qty 1

## 2020-06-08 MED ORDER — DEXAMETHASONE SODIUM PHOSPHATE 10 MG/ML IJ SOLN
INTRAMUSCULAR | Status: AC
  Filled 2020-06-08: qty 1

## 2020-06-08 MED ORDER — PROPOFOL 10 MG/ML IV BOLUS
INTRAVENOUS | Status: AC
  Filled 2020-06-08: qty 20

## 2020-06-08 MED ORDER — METOCLOPRAMIDE HCL 5 MG/ML IJ SOLN: 5.0000 mg | Freq: Three times a day (TID) | INTRAMUSCULAR | Status: DC | PRN

## 2020-06-08 MED ORDER — PROPOFOL 500 MG/50ML IV EMUL
INTRAVENOUS | Status: DC | PRN
  Administered 2020-06-08: 100 ug/kg/min via INTRAVENOUS

## 2020-06-08 MED ORDER — DIPHENHYDRAMINE HCL 12.5 MG/5ML PO ELIX: 12.5000 mg | ORAL_SOLUTION | ORAL | Status: DC | PRN

## 2020-06-08 MED ORDER — BUPIVACAINE LIPOSOME 1.3 % IJ SUSP
20.0000 mL | Freq: Once | INTRAMUSCULAR | Status: AC
  Filled 2020-06-08: qty 20

## 2020-06-08 MED ORDER — POLYETHYLENE GLYCOL 3350 17 G PO PACK: 17.0000 g | PACK | Freq: Every day | ORAL | Status: DC | PRN

## 2020-06-08 MED ORDER — METHOCARBAMOL 500 MG PO TABS: 500.0000 mg | ORAL_TABLET | Freq: Four times a day (QID) | ORAL | Status: DC | PRN

## 2020-06-08 MED ORDER — METHOCARBAMOL 500 MG IVPB - SIMPLE MED
500.0000 mg | Freq: Four times a day (QID) | INTRAVENOUS | Status: DC | PRN
  Filled 2020-06-08: qty 50

## 2020-06-08 MED ORDER — PHENYLEPHRINE HCL-NACL 10-0.9 MG/250ML-% IV SOLN
INTRAVENOUS | Status: AC
  Filled 2020-06-08: qty 250

## 2020-06-08 MED ORDER — AMISULPRIDE (ANTIEMETIC) 5 MG/2ML IV SOLN
10.0000 mg | Freq: Once | INTRAVENOUS | Status: DC | PRN
Start: 2020-06-08 — End: 2020-06-08

## 2020-06-08 MED ORDER — OXYCODONE HCL 5 MG PO TABS: 10.0000 mg | ORAL_TABLET | ORAL | Status: DC | PRN

## 2020-06-08 MED ORDER — MELOXICAM 15 MG PO TABS
15.0000 mg | ORAL_TABLET | Freq: Once | ORAL | Status: AC
  Administered 2020-06-08: 15 mg via ORAL
  Filled 2020-06-08: qty 1

## 2020-06-08 MED ORDER — ONDANSETRON HCL 4 MG/2ML IJ SOLN: 4.0000 mg | Freq: Four times a day (QID) | INTRAMUSCULAR | Status: DC | PRN

## 2020-06-08 MED ORDER — PANTOPRAZOLE SODIUM 40 MG PO TBEC
40.0000 mg | DELAYED_RELEASE_TABLET | Freq: Every day | ORAL | Status: DC
  Administered 2020-06-08 – 2020-06-09 (×2): 40 mg via ORAL
  Filled 2020-06-08 (×2): qty 1

## 2020-06-08 MED ORDER — CEFAZOLIN SODIUM-DEXTROSE 2-4 GM/100ML-% IV SOLN
2.0000 g | INTRAVENOUS | Status: AC
  Administered 2020-06-08: 2 g via INTRAVENOUS
  Filled 2020-06-08: qty 100

## 2020-06-08 MED ORDER — BUPIVACAINE LIPOSOME 1.3 % IJ SUSP
INTRAMUSCULAR | Status: DC | PRN
  Administered 2020-06-08: 20 mL

## 2020-06-08 MED ORDER — EPHEDRINE SULFATE-NACL 50-0.9 MG/10ML-% IV SOSY
PREFILLED_SYRINGE | INTRAVENOUS | Status: DC | PRN
  Administered 2020-06-08: 10 mg via INTRAVENOUS
  Administered 2020-06-08: 15 mg via INTRAVENOUS
  Administered 2020-06-08: 5 mg via INTRAVENOUS

## 2020-06-08 MED ORDER — HYDROCHLOROTHIAZIDE 25 MG PO TABS
25.0000 mg | ORAL_TABLET | Freq: Every day | ORAL | Status: DC
  Administered 2020-06-09: 25 mg via ORAL
  Filled 2020-06-08: qty 1

## 2020-06-08 MED ORDER — METOCLOPRAMIDE HCL 5 MG PO TABS: 5.0000 mg | ORAL_TABLET | Freq: Three times a day (TID) | ORAL | Status: DC | PRN

## 2020-06-08 MED ORDER — TRAMADOL HCL 50 MG PO TABS
50.0000 mg | ORAL_TABLET | Freq: Four times a day (QID) | ORAL | Status: DC
  Administered 2020-06-08 – 2020-06-09 (×4): 50 mg via ORAL
  Filled 2020-06-08 (×4): qty 1

## 2020-06-08 MED ORDER — METOPROLOL SUCCINATE ER 100 MG PO TB24
100.0000 mg | ORAL_TABLET | Freq: Every day | ORAL | Status: DC
  Administered 2020-06-09: 100 mg via ORAL
  Filled 2020-06-08: qty 1

## 2020-06-08 MED ORDER — DEXAMETHASONE SODIUM PHOSPHATE 10 MG/ML IJ SOLN
8.0000 mg | Freq: Once | INTRAMUSCULAR | Status: AC
  Administered 2020-06-08: 8 mg via INTRAVENOUS

## 2020-06-08 MED ORDER — PROPOFOL 1000 MG/100ML IV EMUL
INTRAVENOUS | Status: AC
  Filled 2020-06-08: qty 100

## 2020-06-08 MED ORDER — CHLORHEXIDINE GLUCONATE 0.12 % MT SOLN
15.0000 mL | Freq: Once | OROMUCOSAL | Status: AC
  Administered 2020-06-08: 15 mL via OROMUCOSAL

## 2020-06-08 MED ORDER — FENTANYL CITRATE (PF) 100 MCG/2ML IJ SOLN: 25.0000 ug | INTRAMUSCULAR | Status: DC | PRN

## 2020-06-08 MED ORDER — PROPOFOL 10 MG/ML IV BOLUS
INTRAVENOUS | Status: DC | PRN
  Administered 2020-06-08 (×5): 20 mg via INTRAVENOUS

## 2020-06-08 MED ORDER — FENTANYL CITRATE (PF) 100 MCG/2ML IJ SOLN
INTRAMUSCULAR | Status: AC
  Filled 2020-06-08: qty 2

## 2020-06-08 MED ORDER — DOCUSATE SODIUM 100 MG PO CAPS
100.0000 mg | ORAL_CAPSULE | Freq: Two times a day (BID) | ORAL | Status: DC
  Administered 2020-06-08 – 2020-06-09 (×2): 100 mg via ORAL
  Filled 2020-06-08 (×2): qty 1

## 2020-06-08 MED ORDER — LOSARTAN POTASSIUM 50 MG PO TABS
100.0000 mg | ORAL_TABLET | Freq: Every day | ORAL | Status: DC
  Administered 2020-06-09: 100 mg via ORAL
  Filled 2020-06-08: qty 2

## 2020-06-08 MED ORDER — PHENOL 1.4 % MT LIQD: 1.0000 | OROMUCOSAL | Status: DC | PRN

## 2020-06-08 MED ORDER — BUPIVACAINE IN DEXTROSE 0.75-8.25 % IT SOLN
INTRATHECAL | Status: DC | PRN
  Administered 2020-06-08: 1.8 mL via INTRATHECAL

## 2020-06-08 MED ORDER — ORAL CARE MOUTH RINSE: 15.0000 mL | Freq: Once | OROMUCOSAL | Status: AC

## 2020-06-08 MED ORDER — ONDANSETRON HCL 4 MG/2ML IJ SOLN
INTRAMUSCULAR | Status: DC | PRN
  Administered 2020-06-08: 4 mg via INTRAVENOUS

## 2020-06-08 MED ORDER — ACETAMINOPHEN 500 MG PO TABS
1000.0000 mg | ORAL_TABLET | Freq: Four times a day (QID) | ORAL | Status: AC
  Administered 2020-06-08 – 2020-06-09 (×4): 1000 mg via ORAL
  Filled 2020-06-08 (×4): qty 2

## 2020-06-08 MED ORDER — BISACODYL 10 MG RE SUPP: 10.0000 mg | Freq: Every day | RECTAL | Status: DC | PRN

## 2020-06-08 MED ORDER — HYDROMORPHONE HCL 1 MG/ML IJ SOLN: 0.5000 mg | INTRAMUSCULAR | Status: DC | PRN

## 2020-06-08 MED ORDER — ACETAMINOPHEN 10 MG/ML IV SOLN: 1000.0000 mg | Freq: Once | INTRAVENOUS | Status: DC | PRN

## 2020-06-08 MED ORDER — AMLODIPINE BESYLATE 5 MG PO TABS
5.0000 mg | ORAL_TABLET | Freq: Every day | ORAL | Status: DC
  Administered 2020-06-09: 5 mg via ORAL
  Filled 2020-06-08: qty 1

## 2020-06-08 MED ORDER — 0.9 % SODIUM CHLORIDE (POUR BTL) OPTIME
TOPICAL | Status: DC | PRN
  Administered 2020-06-08: 1000 mL

## 2020-06-08 MED ORDER — LOSARTAN POTASSIUM-HCTZ 100-25 MG PO TABS: 1.0000 | ORAL_TABLET | Freq: Every day | ORAL | Status: DC

## 2020-06-08 MED ORDER — ONDANSETRON HCL 4 MG/2ML IJ SOLN
INTRAMUSCULAR | Status: AC
  Filled 2020-06-08: qty 2

## 2020-06-08 MED ORDER — TRANEXAMIC ACID-NACL 1000-0.7 MG/100ML-% IV SOLN
1000.0000 mg | INTRAVENOUS | Status: AC
  Administered 2020-06-08: 1000 mg via INTRAVENOUS
  Filled 2020-06-08: qty 100

## 2020-06-08 MED ORDER — TRANEXAMIC ACID-NACL 1000-0.7 MG/100ML-% IV SOLN
1000.0000 mg | Freq: Once | INTRAVENOUS | Status: AC
  Administered 2020-06-08: 1000 mg via INTRAVENOUS
  Filled 2020-06-08: qty 100

## 2020-06-08 MED ORDER — MIDAZOLAM HCL 5 MG/5ML IJ SOLN
INTRAMUSCULAR | Status: DC | PRN
  Administered 2020-06-08: 2 mg via INTRAVENOUS

## 2020-06-08 MED ORDER — SODIUM CHLORIDE (PF) 0.9 % IJ SOLN
INTRAMUSCULAR | Status: AC
  Filled 2020-06-08: qty 20

## 2020-06-08 MED ORDER — PROMETHAZINE HCL 25 MG/ML IJ SOLN: 6.2500 mg | INTRAMUSCULAR | Status: DC | PRN

## 2020-06-08 MED ORDER — OXYCODONE HCL 5 MG PO TABS
5.0000 mg | ORAL_TABLET | Freq: Once | ORAL | Status: DC | PRN
Start: 2020-06-08 — End: 2020-06-08

## 2020-06-08 MED ORDER — ASPIRIN 81 MG PO CHEW
81.0000 mg | CHEWABLE_TABLET | Freq: Two times a day (BID) | ORAL | Status: DC
  Administered 2020-06-08 – 2020-06-09 (×2): 81 mg via ORAL
  Filled 2020-06-08 (×2): qty 1

## 2020-06-08 MED ORDER — EPHEDRINE 5 MG/ML INJ
INTRAVENOUS | Status: AC
  Filled 2020-06-08: qty 10

## 2020-06-08 SURGICAL SUPPLY — 39 items
APL PRP STRL LF DISP 70% ISPRP (MISCELLANEOUS) ×1
BLADE SAG 18X100X1.27 (BLADE) ×2 IMPLANT
CHLORAPREP W/TINT 26 (MISCELLANEOUS) ×2 IMPLANT
COVER PERINEAL POST (MISCELLANEOUS) ×2 IMPLANT
COVER SURGICAL LIGHT HANDLE (MISCELLANEOUS) ×2 IMPLANT
DECANTER SPIKE VIAL GLASS SM (MISCELLANEOUS) ×4 IMPLANT
DRAPE INCISE 23X17 IOBAN STRL (DRAPES) ×1
DRAPE INCISE 23X17 STRL (DRAPES) IMPLANT
DRAPE INCISE IOBAN 23X17 STRL (DRAPES) ×1 IMPLANT
DRAPE STERI IOBAN 125X83 (DRAPES) ×2 IMPLANT
DRAPE U-SHAPE 47X51 STRL (DRAPES) ×4 IMPLANT
DRSG MEPILEX BORDER 4X8 (GAUZE/BANDAGES/DRESSINGS) ×2 IMPLANT
ELECT REM PT RETURN 15FT ADLT (MISCELLANEOUS) ×2 IMPLANT
GOWN STRL REUS W/TWL LRG LVL3 (GOWN DISPOSABLE) ×2 IMPLANT
GOWN STRL REUS W/TWL XL LVL3 (GOWN DISPOSABLE) ×2 IMPLANT
HEAD BIOLOX HIP 36/+2.5 (Joint) IMPLANT
HEAD BIOLOX HIP 36/-2.5 (Joint) IMPLANT
HIP BIOLOX HD 36/+2.5 (Joint) ×2 IMPLANT
HIP BIOLOX HD 36/-2.5 (Joint) ×2 IMPLANT
HOLDER FOLEY CATH W/STRAP (MISCELLANEOUS) ×1 IMPLANT
INSERT 0 DEGREE 36 (Miscellaneous) ×1 IMPLANT
KIT TURNOVER KIT A (KITS) ×2 IMPLANT
MANIFOLD NEPTUNE II (INSTRUMENTS) ×2 IMPLANT
NS IRRIG 1000ML POUR BTL (IV SOLUTION) ×2 IMPLANT
PACK ANTERIOR HIP CUSTOM (KITS) ×2 IMPLANT
PROTECTOR NERVE ULNAR (MISCELLANEOUS) ×2 IMPLANT
SCREW HEX LP 6.5X20 (Screw) ×1 IMPLANT
SHELL TRIDENT II CLUST 50 (Shell) ×1 IMPLANT
STEM FEM ACCOLADE 38X102X30 S3 (Stem) ×1 IMPLANT
STRIP CLOSURE SKIN 1/2X4 (GAUZE/BANDAGES/DRESSINGS) ×1 IMPLANT
SUT MNCRL AB 3-0 PS2 18 (SUTURE) ×2 IMPLANT
SUT STRATAFIX 0 PDS 27 VIOLET (SUTURE) ×2
SUT VIC AB 0 CT1 36 (SUTURE) ×2 IMPLANT
SUT VIC AB 1 CT1 36 (SUTURE) ×2 IMPLANT
SUT VIC AB 2-0 CT1 27 (SUTURE) ×4
SUT VIC AB 2-0 CT1 TAPERPNT 27 (SUTURE) ×2 IMPLANT
SUTURE STRATFX 0 PDS 27 VIOLET (SUTURE) ×1 IMPLANT
TUBE SUCTION HIGH CAP CLEAR NV (SUCTIONS) ×2 IMPLANT
WATER STERILE IRR 1000ML POUR (IV SOLUTION) ×4 IMPLANT

## 2020-06-08 NOTE — Discharge Instructions (Signed)
POST-OPERATIVE OPIOID TAPER INSTRUCTIONS: . It is important to wean off of your opioid medication as soon as possible. If you do not need pain medication after your surgery it is ok to stop day one. . Opioids include: o Codeine, Hydrocodone(Norco, Vicodin), Oxycodone(Percocet, oxycontin) and hydromorphone amongst others.  . Long term and even short term use of opiods can cause: o Increased pain response o Dependence o Constipation o Depression o Respiratory depression o And more.  . Withdrawal symptoms can include o Flu like symptoms o Nausea, vomiting o And more . Techniques to manage these symptoms o Hydrate well o Eat regular healthy meals o Stay active o Use relaxation techniques(deep breathing, meditating, yoga) . Do Not substitute Alcohol to help with tapering . If you have been on opioids for less than two weeks and do not have pain than it is ok to stop all together.  . Plan to wean off of opioids o This plan should start within one week post op of your joint replacement. o Maintain the same interval or time between taking each dose and first decrease the dose.  o Cut the total daily intake of opioids by one tablet each day o Next start to increase the time between doses. o The last dose that should be eliminated is the evening dose.    

## 2020-06-08 NOTE — Op Note (Signed)
06/08/2020  4:10 PM  PATIENT:  Crystal Ramirez   MRN: [id number]  PRE-OPERATIVE DIAGNOSIS:  djd left hip  POST-OPERATIVE DIAGNOSIS:  djd left hip  PROCEDURE:  Procedure(s): TOTAL HIP ARTHROPLASTY ANTERIOR APPROACH  PREOPERATIVE INDICATIONS:    [place] Tabbetha Kutscher is an 60 y.o. female who has a diagnosis of <principal problem not specified> and elected for surgical management after failing conservative treatment.  The risks benefits and alternatives were discussed with the patient including but not limited to the risks of nonoperative treatment, versus surgical intervention including infection, bleeding, nerve injury, periprosthetic fracture, the need for revision surgery, dislocation, leg length discrepancy, blood clots, cardiopulmonary complications, morbidity, mortality, among others, and they were willing to proceed.     OPERATIVE REPORT     SURGEON:   Renette Butters, MD    ASSISTANT:  Aggie Moats, PA-C, he was present and scrubbed throughout the case, critical for completion in a timely fashion, and for retraction, instrumentation, and closure.     ANESTHESIA:  General    COMPLICATIONS:  None.     COMPONENTS:  Stryker acolade fit femur size 3 with a 36 mm +2.5 head ball and an acetabular shell size 50 with a  polyethylene liner    PROCEDURE IN DETAIL:   The patient was met in the holding area and  identified.  The appropriate hip was identified and marked at the operative site.  The patient was then transported to the OR  and  placed under anesthesia per that record.  At that point, the patient was  placed in the supine position and  secured to the operating room table and all bony prominences padded. He received pre-operative antibiotics    The operative lower extremity was prepped from the iliac crest to the distal leg.  Sterile draping was performed.  Time out was performed prior to incision.      Skin incision was made just 2 cm lateral to the  ASIS  extending in line with the tensor fascia lata. Electrocautery was used to control all bleeders. I dissected down sharply to the fascia of the tensor fascia lata was confirmed that the muscle fibers beneath were running posteriorly. I then incised the fascia over the superficial tensor fascia lata in line with the incision. The fascia was elevated off the anterior aspect of the muscle the muscle was retracted posteriorly and protected throughout the case. I then used electrocautery to incise the tensor fascia lata fascia control and all bleeders. Immediately visible was the fat over top of the anterior neck and capsule.  I removed the anterior fat from the capsule and elevated the rectus muscle off of the anterior capsule. I then removed a large time of capsule. The retractors were then placed over the anterior acetabulum as well as around the superior and inferior neck.  I then made a femoral neck cut. Then used the power corkscrew to remove the femoral head from the acetabulum and thoroughly irrigated the acetabulum. I sized the femoral head.    I then exposed the deep acetabulum, cleared out any tissue including the ligamentum teres.   After adequate visualization, I excised the labrum, and then sequentially reamed.  I then impacted the acetabular implant into place using fluoroscopy for guidance.  Appropriate version and inclination was confirmed clinically matching their bony anatomy, and with fluoroscopy.  I placed a 20 mm screw in the posterior/superio position with an excellent bite.    I then placed the polyethylene liner in  place  I then adducted the leg and released the external rotators from the posterior femur allowing it to be easily delivered up lateral and anterior to the acetabulum for preparation of the femoral canal.    I then prepared the proximal femur using the cookie-cutter and then sequentially reamed and broached.  A trial broach, neck, and head was utilized, and I  reduced the hip and used floroscopy to assess the neck length and femoral implant.  I then impacted the femoral prosthesis into place into the appropriate version. The hip was then reduced and fluoroscopy confirmed appropriate position. Leg lengths were restored.  I then irrigated the hip copiously again with, and repaired the fascia with Vicryl, followed by monocryl for the subcutaneous tissue, Monocryl for the skin, Steri-Strips and sterile gauze. The patient was then awakened and returned to PACU in stable and satisfactory condition. There were no complications.  POST OPERATIVE PLAN: WBAT, DVT px: SCD's/TED, ambulation and chemical dvt px  Edmonia Lynch, MD Orthopedic Surgeon [phone]

## 2020-06-08 NOTE — Anesthesia Procedure Notes (Signed)
Spinal  Patient location during procedure: OR Start time: 06/08/2020 2:40 PM End time: 06/08/2020 2:55 PM Reason for block: surgical anesthesia Staffing Anesthesiologist: Bethena Midget, MD Preanesthetic Checklist Completed: patient identified, IV checked, site marked, risks and benefits discussed, surgical consent, monitors and equipment checked, pre-op evaluation and timeout performed Spinal Block Patient position: sitting Prep: DuraPrep Patient monitoring: heart rate, cardiac monitor, continuous pulse ox and blood pressure Approach: midline Location: L4-5 Injection technique: single-shot Needle Needle type: Sprotte  Needle gauge: 24 G Needle length: 9 cm Assessment Sensory level: T4 Events: CSF return Additional Notes osso X 4 change to L2/3 paramedian with 22g second pass +CSF/DOSE/+CSF ASP

## 2020-06-08 NOTE — Anesthesia Postprocedure Evaluation (Signed)
Anesthesia Post Note  Patient: Malisa Ruggiero  Procedure(s) Performed: TOTAL HIP ARTHROPLASTY ANTERIOR APPROACH (Left Hip)     Patient location during evaluation: PACU Anesthesia Type: Spinal Level of consciousness: oriented and awake and alert Pain management: pain level controlled Vital Signs Assessment: post-procedure vital signs reviewed and stable Respiratory status: spontaneous breathing, respiratory function stable and patient connected to nasal cannula oxygen Cardiovascular status: blood pressure returned to baseline and stable Postop Assessment: no headache, no backache and no apparent nausea or vomiting Anesthetic complications: no   No complications documented.  Last Vitals:  Vitals:   06/08/20 1815 06/08/20 1830  BP: 117/85 (!) 146/89  Pulse: 69 72  Resp: 17 14  Temp: 36.4 C 36.5 C  SpO2: 97% 100%    Last Pain:  Vitals:   06/08/20 1837  TempSrc:   PainSc: 3                  Giannamarie Paulus

## 2020-06-08 NOTE — Interval H&P Note (Signed)
History and Physical Interval Note:  06/08/2020 10:45 AM  Crystal Ramirez  has presented today for surgery, with the diagnosis of djd left hip.  The various methods of treatment have been discussed with the patient and family. After consideration of risks, benefits and other options for treatment, the patient has consented to  Procedure(s): TOTAL HIP ARTHROPLASTY ANTERIOR APPROACH (Left) as a surgical intervention.  The patient's history has been reviewed, patient examined, no change in status, stable for surgery.  I have reviewed the patient's chart and labs.  Questions were answered to the patient's satisfaction.     Sheral Apley

## 2020-06-08 NOTE — Anesthesia Preprocedure Evaluation (Addendum)
Anesthesia Evaluation  Patient identified by MRN, date of birth, ID band Patient awake    Reviewed: Allergy & Precautions, NPO status , Patient's Chart, lab work & pertinent test results  Airway Mallampati: II  TM Distance: >3 FB Neck ROM: Full    Dental no notable dental hx.    Pulmonary neg pulmonary ROS,    Pulmonary exam normal breath sounds clear to auscultation       Cardiovascular hypertension, Pt. on medications and Pt. on home beta blockers Normal cardiovascular exam Rhythm:Regular Rate:Normal  ECG: NSR, rate 61   Neuro/Psych negative neurological ROS  negative psych ROS   GI/Hepatic negative GI ROS, Neg liver ROS,   Endo/Other  negative endocrine ROS  Renal/GU negative Renal ROS     Musculoskeletal  (+) Arthritis ,   Abdominal (+) + obese,   Peds  Hematology negative hematology ROS (+)   Anesthesia Other Findings djd left hip  Reproductive/Obstetrics                            Anesthesia Physical Anesthesia Plan  ASA: II  Anesthesia Plan: Spinal   Post-op Pain Management:    Induction: Intravenous  PONV Risk Score and Plan: 2 and Ondansetron, Dexamethasone, Treatment may vary due to age or medical condition and Midazolam  Airway Management Planned: Simple Face Mask  Additional Equipment:   Intra-op Plan:   Post-operative Plan:   Informed Consent: I have reviewed the patients History and Physical, chart, labs and discussed the procedure including the risks, benefits and alternatives for the proposed anesthesia with the patient or authorized representative who has indicated his/her understanding and acceptance.     Dental advisory given  Plan Discussed with: CRNA  Anesthesia Plan Comments:         Anesthesia Quick Evaluation

## 2020-06-08 NOTE — Transfer of Care (Signed)
Immediate Anesthesia Transfer of Care Note  Patient: Crystal Ramirez  Procedure(s) Performed: TOTAL HIP ARTHROPLASTY ANTERIOR APPROACH (Left Hip)  Patient Location: PACU  Anesthesia Type:Spinal  Level of Consciousness: drowsy and patient cooperative  Airway & Oxygen Therapy: Patient Spontanous Breathing and Patient connected to face mask oxygen  Post-op Assessment: Report given to RN and Post -op Vital signs reviewed and stable  Post vital signs: Reviewed and stable  Last Vitals:  Vitals Value Taken Time  BP 112/63 06/08/20 1703  Temp    Pulse 77 06/08/20 1709  Resp 18 06/08/20 1709  SpO2 96 % 06/08/20 1709  Vitals shown include unvalidated device data.  Last Pain:  Vitals:   06/08/20 1200  TempSrc:   PainSc: 0-No pain      Patients Stated Pain Goal: 4 (06/08/20 1200)  Complications: No complications documented.

## 2020-06-08 NOTE — Anesthesia Procedure Notes (Signed)
Procedure Name: MAC Date/Time: 06/08/2020 2:34 PM Performed by: Niel Hummer, CRNA Pre-anesthesia Checklist: Patient identified, Emergency Drugs available, Suction available and Patient being monitored Oxygen Delivery Method: Simple face mask

## 2020-06-09 ENCOUNTER — Encounter (HOSPITAL_COMMUNITY): Payer: Self-pay | Admitting: Orthopedic Surgery

## 2020-06-09 DIAGNOSIS — M1612 Unilateral primary osteoarthritis, left hip: Secondary | ICD-10-CM | POA: Diagnosis not present

## 2020-06-09 MED ORDER — ONDANSETRON HCL 4 MG PO TABS: 4.0000 mg | ORAL_TABLET | Freq: Every day | ORAL | 0 refills | Status: AC | PRN

## 2020-06-09 MED ORDER — ACETAMINOPHEN 500 MG PO TABS: 500.0000 mg | ORAL_TABLET | Freq: Four times a day (QID) | ORAL | 0 refills | Status: DC | PRN

## 2020-06-09 MED ORDER — METHOCARBAMOL 500 MG PO TABS: 500.0000 mg | ORAL_TABLET | Freq: Three times a day (TID) | ORAL | 0 refills | Status: AC | PRN

## 2020-06-09 MED ORDER — OMEPRAZOLE MAGNESIUM 20 MG PO TBEC: 20.0000 mg | DELAYED_RELEASE_TABLET | Freq: Every day | ORAL | 0 refills | Status: AC

## 2020-06-09 MED ORDER — ASPIRIN EC 81 MG PO TBEC: 81.0000 mg | DELAYED_RELEASE_TABLET | Freq: Two times a day (BID) | ORAL | 0 refills | Status: AC

## 2020-06-09 MED ORDER — HYDROCODONE-ACETAMINOPHEN 10-325 MG PO TABS: 1.0000 | ORAL_TABLET | Freq: Four times a day (QID) | ORAL | 0 refills | Status: AC | PRN

## 2020-06-09 NOTE — Progress Notes (Signed)
PT discharged home, DME rolling walker with patient

## 2020-06-09 NOTE — TOC Transition Note (Signed)
Transition of Care [facility]) - CM/SW Discharge Note   Patient Details  Name: Crystal Ramirez MRN: [id number] Date of Birth: Apr 14, 1960  [facility]) CM/SW Contact:  Lennart Pall, LCSW Phone Number: 06/09/2020, 1:55 PM   Clinical Narrative:    Met with pt this morning and confirming she has received her rw via [place].  Pt aware that OPPT is recommended, however, she has concerns about getting to appointments as daughter will only be at home a few days.  MD office has OK'd HHPT plan and orders placed.  Pt without agency preference.  Referral placed with [facility].  No further TOC needs.   Final next level of care: [place] Barriers to Discharge: No Barriers Identified   Patient Goals and CMS Choice Patient states their goals for this hospitalization and ongoing recovery are:: return home CMS Medicare.gov Compare Post Acute Care list provided to:: Patient Choice offered to / list presented to : Patient  Discharge Placement                       Discharge Plan and Services                DME Arranged: Walker rolling DME Agency: [employer] Date DME Agency Contacted:  (ordered prior to surgery)     HH Arranged: PT [place]: [facility] [employer] (now [employer] Date [place]: 06/09/20 Time [place]: 1355 Representative spoke with at [place]: Theotis Barrio.  Social Determinants of Health (SDOH) Interventions     Readmission Risk Interventions No flowsheet data found.

## 2020-06-09 NOTE — Evaluation (Signed)
Occupational Therapy Evaluation Patient Details Name: Crystal Ramirez MRN: [id number] DOB: 10-26-1960 Today's Date: 06/09/2020    History of Present Illness Patient is a 60 year old female admitted 4/12 for L total hip arthroplasty, anterior approach.   Clinical Impression   Patient lives alone in a single level house with 2 steps to enter. Works as a Architectural technologist in kindergarten, and is fully independent at baseline. Patient reports her daughter will be staying with her initially, and has friends that can check in on her. OT educate patient on compensatory strategies for self care tasks including set up of 3 in 1 in shower, LB bathing and dressing technique. Patient verbalize understanding. Patient did not require any physical assistance during transfers, LB ADLs or sink side g/h, sponge bathing. All education completed, acute OT will sign off. Please re-consult if new needs arise.    Follow Up Recommendations  No OT follow up;Supervision - Intermittent    Equipment Recommendations  None recommended by OT       Precautions / Restrictions Precautions Precautions: Anterior Hip Restrictions Weight Bearing Restrictions: Yes LLE Weight Bearing: Weight bearing as tolerated      Mobility Bed Mobility Overal bed mobility: Modified Independent             General bed mobility comments: HOB elevated    Transfers Overall transfer level: Modified independent Equipment used: Rolling walker (2 wheeled)             General transfer comment: provided verbal cues for body mechanics, patient able to follow safely and without physical assistance    Balance Overall balance assessment: Modified Independent                                         ADL either performed or assessed with clinical judgement   ADL Overall ADL's : Modified independent                                       General ADL Comments: Patient demonstrates  LB dressing, toilet transfer and sink side g/h, sponge bathing without physical assistance. did provide education regarding compensatory strategies to maximize safety with self care tasks                  Pertinent Vitals/Pain Pain Assessment: Faces Faces Pain Scale: Hurts little more Pain Location: L hip Pain Descriptors / Indicators: Sore Pain Intervention(s): Monitored during session     Hand Dominance Right   Extremity/Trunk Assessment Upper Extremity Assessment Upper Extremity Assessment: Overall WFL for tasks assessed   Lower Extremity Assessment Lower Extremity Assessment: Defer to PT evaluation   Cervical / Trunk Assessment Cervical / Trunk Assessment: Normal   Communication Communication Communication: No difficulties   Cognition Arousal/Alertness: Awake/alert Behavior During Therapy: WFL for tasks assessed/performed Overall Cognitive Status: Within Functional Limits for tasks assessed                                                Home Living Family/patient expects to be discharged to:: Private residence Living Arrangements: Children;Other (Comment) Available Help at Discharge: Family;Friend(s);Available PRN/intermittently Type of Home: House Home Access: Stairs to enter [employer]  of Steps: 2   Home Layout: One level     Bathroom Shower/Tub: Chief Strategy Officer: Handicapped height     Home Equipment: Bedside commode;Walker - 2 wheels   Additional Comments: patient reports walker has been ordered      Prior Functioning/Environment Level of Independence: Independent                 OT Problem List: Pain;Decreased knowledge of use of DME or AE         OT Goals(Current goals can be found in the care plan section) Acute Rehab OT Goals Patient Stated Goal: get back to work OT Goal Formulation: All assessment and education complete, DC therapy   AM-PAC OT "6 Clicks" Daily Activity     Outcome  Measure Help from another person eating meals?: None Help from another person taking care of personal grooming?: None Help from another person toileting, which includes using toliet, bedpan, or urinal?: None Help from another person bathing (including washing, rinsing, drying)?: None Help from another person to put on and taking off regular upper body clothing?: None Help from another person to put on and taking off regular lower body clothing?: None 6 Click Score: 24   End of Session Equipment Utilized During Treatment: Rolling walker Nurse Communication: Mobility status  Activity Tolerance: Patient tolerated treatment well Patient left: in chair;with call bell/phone within reach  OT Visit Diagnosis: Pain Pain - Right/Left: Left Pain - part of body: Hip                Time: 0719-0802 OT Time Calculation (min): 43 min Charges:  OT General Charges $OT Visit: 1 Visit OT Evaluation $OT Eval Low Complexity: 1 Low OT Treatments $Self Care/Home Management : 23-37 mins  Marlyce Huge OT OT pager: [phone]  Carmelia Roller 06/09/2020, 9:28 AM

## 2020-06-09 NOTE — Plan of Care (Signed)
Plan of care reviewed and discussed with the patient. 

## 2020-06-09 NOTE — Progress Notes (Signed)
Physical Therapy Treatment Patient Details Name: Crystal Ramirez MRN: [id number] DOB: 1960/09/14 Today's Date: 06/09/2020    History of Present Illness 60 yo female s/p L THA-direct anterior 06/08/20    PT Comments    Pt continues to progress well. Reviewed/practiced gait and stair training. All education completed. Okay to d/c from PT standpoint-RN aware.    Follow Up Recommendations  Follow surgeon's recommendation for DC plan and follow-up therapies     Equipment Recommendations  None recommended by PT    Recommendations for Other Services       Precautions / Restrictions Precautions Precautions: Fall Restrictions Weight Bearing Restrictions: No LLE Weight Bearing: Weight bearing as tolerated    Mobility  Bed Mobility Overal bed mobility: Needs Assistance Bed Mobility: Supine to Sit     Supine to sit: HOB elevated;Supervision     General bed mobility comments: increased time.    Transfers Overall transfer level: Needs assistance Equipment used: Rolling walker (2 wheeled) Transfers: Sit to/from Stand Sit to Stand: Supervision         General transfer comment: Supv for safety.  Ambulation/Gait Ambulation/Gait assistance: Supervision Gait Distance (Feet): 100 Feet Assistive device: Rolling walker (2 wheeled) Gait Pattern/deviations: Step-through pattern;Decreased stride length     General Gait Details: Supv for safety. No LOB with RW use.   Stairs Stairs: Yes Stairs assistance: Min assist Stair Management: Step to pattern;Backwards;With walker Number of Stairs: 2 General stair comments: Cues for safety, technique, sequence. Daughter present to observe and assist with stabilize walker.   Wheelchair Mobility    Modified Rankin (Stroke Patients Only)       Balance Overall balance assessment: Mild deficits observed, not formally tested                                          Cognition Arousal/Alertness:  Awake/alert Behavior During Therapy: WFL for tasks assessed/performed Overall Cognitive Status: Within Functional Limits for tasks assessed                                        Exercises Total Joint Exercises Ankle Circles/Pumps: AROM;Both;10 reps Quad Sets: AROM;Both;10 reps Heel Slides: AAROM;Left;10 reps Hip ABduction/ADduction: AAROM;Left;10 reps    General Comments        Pertinent Vitals/Pain Pain Assessment: 0-10 Pain Score: 5  Pain Location: L hip/thigh Pain Descriptors / Indicators: Sore;Discomfort Pain Intervention(s): Limited activity within patient's tolerance;Monitored during session;Ice applied;Repositioned    Home Living Family/patient expects to be discharged to:: Private residence Living Arrangements: Children;Other (Comment) Available Help at Discharge: Family;Friend(s);Available PRN/intermittently Type of Home: House Home Access: Stairs to enter Entrance Stairs-Rails: None Home Layout: One level Home Equipment: Bedside commode;Walker - 2 wheels Additional Comments: patient reports walker has been ordered    Prior Function Level of Independence: Independent          PT Goals (current goals can now be found in the care plan section) Acute Rehab PT Goals Patient Stated Goal: get back to work PT Goal Formulation: With patient Time For Goal Achievement: 06/23/20 Potential to Achieve Goals: Good Progress towards PT goals: Progressing toward goals    Frequency    7X/week      PT Plan Current plan remains appropriate    Co-evaluation  AM-PAC PT "6 Clicks" Mobility   Outcome Measure  Help needed turning from your back to your side while in a flat bed without using bedrails?: A Little Help needed moving from lying on your back to sitting on the side of a flat bed without using bedrails?: A Little Help needed moving to and from a bed to a chair (including a wheelchair)?: A Little Help needed standing up from  a chair using your arms (e.g., wheelchair or bedside chair)?: A Little Help needed to walk in hospital room?: A Little Help needed climbing 3-5 steps with a railing? : A Little 6 Click Score: 18    End of Session Equipment Utilized During Treatment: Gait belt Activity Tolerance: Patient tolerated treatment well Patient left: in chair;with call bell/phone within reach;with family/visitor present   PT Visit Diagnosis: Other abnormalities of gait and mobility (R26.89);Pain Pain - Right/Left: Left Pain - part of body: Hip     Time: 0263-7858 PT Time Calculation (min) (ACUTE ONLY): 16 min  Charges:  $Gait Training: 8-22 mins                         Faye Ramsay, PT Acute Rehabilitation  Office: [phone] Pager: [phone]

## 2020-06-09 NOTE — Progress Notes (Signed)
    Subjective: Patient reports pain as mild to moderate. Well controlled with medicine. Slept well. Tolerating diet. Urinating. No CP, SOB. OOB sitting in chair. Hasn't worked with PT yet.   Objective:   VITALS:   Vitals:   06/08/20 1857 06/08/20 2018 06/09/20 0009 06/09/20 0553  BP:  (!) 143/78 130/77 125/77  Pulse:  73 84 76  Resp:  16 18 18   Temp:  98.2 F (36.8 C) 98.3 F (36.8 C) 98.4 F (36.9 C)  TempSrc:  Oral Oral   SpO2:  98% 97% 95%  Weight: 107.1 kg     Height: 5\' 6"  (1.676 m)      CBC Latest Ref Rng & Units 06/02/2020  WBC 4.0 - 10.5 K/uL 6.6  Hemoglobin 12.0 - 15.0 g/dL  Hematocrit 08/02/2020 - 46.0 % 36.9  Platelets 150 - 400 K/uL 445(H)   BMP Latest Ref Rng & Units 06/02/2020  Glucose 70 - 99 mg/dL 98  BUN 8 - 23 mg/dL 17  Creatinine 56.2 - 08/02/2020 mg/dL 1.30  Sodium 8.65 - 7.84 mmol/L 139  Potassium 3.5 - 5.1 mmol/L 3.3(L)  Chloride 98 - 111 mmol/L 103  CO2 22 - 32 mmol/L 27  Calcium 8.9 - 10.3 mg/dL 9.3   Intake/Output      04/12 0701 04/13 0700 04/13 0701 04/14 0700   I.V. (mL/kg) 1600 (15)    IV Piggyback 400    Total Intake(mL/kg) 2000 (18.7)    Urine (mL/kg/hr) 675    Blood 400    Total Output 1075    Net +925            Physical Exam: General: NAD. Sitting up in chair. Calm, conversant Resp: No increased wob Cardio: regular rate and rhythm ABD soft Neurologically intact MSK Neurovascularly intact Sensation intact distally Intact pulses distally Dorsiflexion/Plantar flexion intact Incision: dressing C/D/I   Assessment: 1 Day Post-Op  S/P Procedure(s) (LRB): TOTAL HIP ARTHROPLASTY ANTERIOR APPROACH (Left) by Dr. 5/13. Murphy on 06/08/20  Active Problems:   S/P total left hip arthroplasty   Plan: Advance diet Up with therapy Incentive Spirometry Elevate and Apply ice  Weightbearing: WBAT LLE Insicional and dressing care: Dressings left intact until follow-up and Reinforce dressings as needed Orthopedic device(s):  None Showering: Keep dressing dry VTE prophylaxis: Aspirin 81mg  BID for 30 days post-op, SCDs, ambulation Pain control: Continue current regimen Follow - up plan: 2 weeks Contact information:  Jewel Baize MD, 08/08/20 PA-C  Dispo: Home likely later today. Waiting on PT/OT evals    Office Margarita Rana 06/09/2020, 9:28 AM

## 2020-06-09 NOTE — Discharge Summary (Signed)
Physician Discharge Summary  Patient ID: Crystal Ramirez MRN: [id number] DOB/AGE: 07/12/1960 60 y.o.  Admit date: 06/08/2020 Discharge date: 06/09/2020  Admission Diagnoses: left hip osteoarthritis  Discharge Diagnoses:  Active Problems:   S/P total left hip arthroplasty   Discharged Condition: good  Hospital Course: Had Left Total Hip Arthroplasty by Dr. Eulah Pont on 06/08/20. Procedure went well. Patient spent the night in observation. Doing well today. Passed PT/OT evals.  Consults: None  Treatments: analgesia: acetaminophen, Vicodin, Dilaudid, Morphine and Oxycodone, anticoagulation: ASA and surgery: left THA  Discharge Exam: Blood pressure 110/73, pulse (!) 55, temperature 98 F (36.7 C), temperature source Oral, resp. rate 17, height 5\' 6"  (1.676 m), weight 107.1 kg, SpO2 96 %. General appearance: alert, cooperative and no distress Head: Normocephalic, without obvious abnormality, atraumatic Eyes: conjunctivae/corneas clear. PERRL, EOM's intact. Fundi benign. Resp: clear to auscultation bilaterally Cardio: regular rate and rhythm, S1, S2 normal, no murmur, click, rub or gallop GI: soft, non-tender; bowel sounds normal; no masses,  no organomegaly Extremities: extremities normal, atraumatic, no cyanosis or edema Pulses: 2+ and symmetric Incision/Wound: covered with bandage that is c/d/i   Disposition: Discharge disposition: 01-Home or Self Care       Discharge Instructions    Call MD / Call 911   Complete by: As directed    If you experience chest pain or shortness of breath, CALL 911 and be transported to the hospital emergency room.  If you develope a fever above 101 F, pus (white drainage) or increased drainage or redness at the wound, or calf pain, call your surgeon's office.   Diet - low sodium heart healthy   Complete by: As directed    Discharge instructions   Complete by: As directed    You may bear weight as tolerated. Keep your dressing on and  dry until follow up. Take medicine to prevent blood clots as directed. Take pain medicine as needed with the goal of transitioning to over the counter medicines.    INSTRUCTIONS AFTER JOINT REPLACEMENT   Remove items at home which could result in a fall. This includes throw rugs or furniture in walking pathways ICE to the affected joint every three hours while awake for 30 minutes at a time, for at least the first 3-5 days, and then as needed for pain and swelling.  Continue to use ice for pain and swelling. You may notice swelling that will progress down to the foot and ankle.  This is normal after surgery.  Elevate your leg when you are not up walking on it.   Continue to use the breathing machine you got in the hospital (incentive spirometer) which will help keep your temperature down.  It is common for your temperature to cycle up and down following surgery, especially at night when you are not up moving around and exerting yourself.  The breathing machine keeps your lungs expanded and your temperature down.   DIET:  As you were doing prior to hospitalization, we recommend a well-balanced diet.  DRESSING / WOUND CARE / SHOWERING  You may shower 3 days after surgery, but keep the wounds dry during showering.  You may use an occlusive plastic wrap (Press'n Seal for example) with blue painter's tape at edges to cover the bandage. NO SOAKING/SUBMERGING IN THE BATHTUB.  If the bandage gets wet, call the office.   ACTIVITY  Increase activity slowly as tolerated, but follow the weight bearing instructions below.   No driving for 6 weeks or until further  direction given by your physician.  You cannot drive while taking narcotics.  No lifting or carrying greater than 10 lbs. until further directed by your surgeon. Avoid periods of inactivity such as sitting longer than an hour when not asleep. This helps prevent blood clots.  You may return to work once you are authorized by your doctor.     WEIGHT BEARING   Weight bearing as tolerated with assist device (walker, cane, etc) as directed, use it as long as suggested by your surgeon or therapist, typically at least 4-6 weeks.   EXERCISES  Results after joint replacement surgery are often greatly improved when you follow the exercise, range of motion and muscle strengthening exercises prescribed by your doctor. Safety measures are also important to protect the joint from further injury. Any time any of these exercises cause you to have increased pain or swelling, decrease what you are doing until you are comfortable again and then slowly increase them. If you have problems or questions, call your caregiver or physical therapist for advice.   Rehabilitation is important following a joint replacement. After just a few days of immobilization, the muscles of the leg can become weakened and shrink (atrophy).  These exercises are designed to build up the tone and strength of the thigh and leg muscles and to improve motion. Often times heat used for twenty to thirty minutes before working out will loosen up your tissues and help with improving the range of motion but do not use heat for the first two weeks following surgery (sometimes heat can increase post-operative swelling).   These exercises can be done on a training (exercise) mat, on the floor, on a table or on a bed. Use whatever works the best and is most comfortable for you.    Use music or television while you are exercising so that the exercises are a pleasant break in your day. This will make your life better with the exercises acting as a break in your routine that you can look forward to.   Perform all exercises about fifteen times, three times per day or as directed.  You should exercise both the operative leg and the other leg as well.  Exercises include:   Quad Sets - Tighten up the muscle on the front of the thigh (Quad) and hold for 5-10 seconds.   Straight Leg Raises -  With your knee straight (if you were given a brace, keep it on), lift the leg to 60 degrees, hold for 3 seconds, and slowly lower the leg.  Perform this exercise against resistance later as your leg gets stronger.  Leg Slides: Lying on your back, slowly slide your foot toward your buttocks, bending your knee up off the floor (only go as far as is comfortable). Then slowly slide your foot back down until your leg is flat on the floor again.  Angel Wings: Lying on your back spread your legs to the side as far apart as you can without causing discomfort.  Hamstring Strength:  Lying on your back, push your heel against the floor with your leg straight by tightening up the muscles of your buttocks.  Repeat, but this time bend your knee to a comfortable angle, and push your heel against the floor.  You may put a pillow under the heel to make it more comfortable if necessary.   A rehabilitation program following joint replacement surgery can speed recovery and prevent re-injury in the future due to weakened muscles. Contact your doctor  or a physical therapist for more information on knee rehabilitation.    CONSTIPATION  Constipation is defined medically as fewer than three stools per week and severe constipation as less than one stool per week.  Even if you have a regular bowel pattern at home, your normal regimen is likely to be disrupted due to multiple reasons following surgery.  Combination of anesthesia, postoperative narcotics, change in appetite and fluid intake all can affect your bowels.   YOU MUST use at least one of the following options; they are listed in order of increasing strength to get the job done.  They are all available over the counter, and you may need to use some, POSSIBLY even all of these options:    Drink plenty of fluids (prune juice may be helpful) and high fiber foods Colace 100 mg by mouth twice a day  Senokot for constipation as directed and as needed Dulcolax (bisacodyl),  take with full glass of water  Miralax (polyethylene glycol) once or twice a day as needed.  If you have tried all these things and are unable to have a bowel movement in the first 3-4 days after surgery call either your surgeon or your primary doctor.    If you experience loose stools or diarrhea, hold the medications until you stool forms back up.  If your symptoms do not get better within 1 week or if they get worse, check with your doctor.  If you experience "the worst abdominal pain ever" or develop nausea or vomiting, please contact the office immediately for further recommendations for treatment.   ITCHING:  If you experience itching with your medications, try taking only a single pain pill, or even half a pain pill at a time.  You can also use Benadryl over the counter for itching or also to help with sleep.   TED HOSE STOCKINGS:  Use stockings on both legs until for at least 2 weeks or as directed by physician office. They may be removed at night for sleeping.  MEDICATIONS:  See your medication summary on the "After Visit Summary" that nursing will review with you.  You may have some home medications which will be placed on hold until you complete the course of blood thinner medication.  It is important for you to complete the blood thinner medication as prescribed.  Take medicines as prescribed.   You have several different medicines that work in different ways. Tylenol is for mild to moderate pain. Try to take this medicine before turning to your narcotic medicines.  Meloxicam is to reduce pain / inflammation Robaxin is for muscle spasms Norco is a narcotic pain medicine. Take this for severe pain. This medicine can be dehydrating / constipating. Zofran is for nausea and vomiting. Omeprazole is for gastric protection while taking medicines.  Aspirin is to prevent blood clots after surgery.   PRECAUTIONS:  If you experience chest pain or shortness of breath - call 911 immediately for  transfer to the hospital emergency department.   If you develop a fever greater that 101 F, purulent drainage from wound, increased redness or drainage from wound, foul odor from the wound/dressing, or calf pain - CONTACT YOUR SURGEON.                                                   FOLLOW-UP APPOINTMENTS:  If you do not already have a post-op appointment, please call the office [phone] for an appointment to be seen by Dr. Eulah Pont in 2 weeks.   OTHER INSTRUCTIONS:   MAKE SURE YOU:  Understand these instructions.  Get help right away if you are not doing well or get worse.    Thank you for letting us be a part of your medical care team.  It is a privilege we respect greatly.  We hope these instructions will help you stay on track for a fast and full recovery!   Follow the hip precautions as taught in Physical Therapy   Complete by: As directed    Weight bearing as tolerated   Complete by: As directed    Laterality: left   Extremity: Lower     Allergies as of 06/09/2020      Reactions   Latex Shortness Of Breath   Ibuprofen    High strengths cause acid reflux   Morphine And Related    whelps       Medication List    STOP taking these medications   Acetaminophen 500 MG capsule Replaced by: acetaminophen 500 MG tablet   gabapentin 100 MG capsule Commonly known as: NEURONTIN   phentermine 15 MG capsule     TAKE these medications   acetaminophen 500 MG tablet Commonly known as: TYLENOL Take 1 tablet (500 mg total) by mouth every 6 (six) hours as needed for mild pain or moderate pain. Replaces: Acetaminophen 500 MG capsule   amLODipine 5 MG tablet Commonly known as: NORVASC Take 5 mg by mouth daily.   aspirin EC 81 MG tablet Take 1 tablet (81 mg total) by mouth 2 (two) times daily. For DVT prophylaxis for 30 days after surgery. What changed:   when to take this  additional instructions   cetirizine 10 MG tablet Commonly known as: ZYRTEC Take 10 mg by mouth  daily as needed for allergies.   fluticasone 50 MCG/ACT nasal spray Commonly known as: FLONASE Place 1 spray into both nostrils daily as needed for allergies.   HYDROcodone-acetaminophen 10-325 MG tablet Commonly known as: Norco Take 1 tablet by mouth every 6 (six) hours as needed for severe pain.   ketotifen 0.025 % ophthalmic solution Commonly known as: ZADITOR Place 1 drop into both eyes daily.   losartan-hydrochlorothiazide 100-25 MG tablet Commonly known as: HYZAAR Take 1 tablet by mouth daily.   meloxicam 15 MG tablet Commonly known as: MOBIC Take 15 mg by mouth daily.   methocarbamol 500 MG tablet Commonly known as: Robaxin Take 1 tablet (500 mg total) by mouth every 8 (eight) hours as needed for muscle spasms.   metoprolol succinate 100 MG 24 hr tablet Commonly known as: TOPROL-XL Take 100 mg by mouth daily. Take with or immediately following a meal.   multivitamin with minerals Tabs tablet Take 1 tablet by mouth daily.   omeprazole 20 MG tablet Commonly known as: PriLOSEC OTC Take 1 tablet (20 mg total) by mouth daily. For gastric protection   ondansetron 4 MG tablet Commonly known as: Zofran Take 1 tablet (4 mg total) by mouth daily as needed for nausea or vomiting.   potassium chloride 10 MEQ tablet Commonly known as: KLOR-CON Take 10 mEq by mouth daily.            Discharge Care Instructions  (From admission, onward)         Start     Ordered   06/09/20 0000  Weight bearing as  tolerated       Question Answer Comment  Laterality left   Extremity Lower      06/09/20 1521          Follow-up Information    Sheral ApleyMurphy, Timothy D, MD. Schedule an appointment as soon as possible for a visit in 1 week.   Specialty: Orthopedic Surgery Contact information: [address] [place] [phone]        [facility] Follow up.   Specialty: Home Health Services Why: to provide home health physical  therapy Contact information: [address] [place] [id number] [phone]               Signed: Marzetta BoardMeghan M Ifeanyi Mickelson PA-C 06/09/2020, 3:21 PM

## 2020-06-09 NOTE — Plan of Care (Signed)
Patient dc'd all care plans met  

## 2020-06-09 NOTE — Evaluation (Signed)
Physical Therapy Evaluation Patient Details Name: Crystal Ramirez MRN: [id number] DOB: 07/11/1960 Today's Date: 06/09/2020   History of Present Illness  60 yo female s/p L THA-direct anterior 06/08/20  Clinical Impression  On eval, pt was Min guard assist for mobility. She walked ~75 feet with a RW. Moderate pain with activity. Will plan to have a 2nd session to practice stair negotiation prior to d/c home later today.     Follow Up Recommendations Follow surgeon's recommendation for DC plan and follow-up therapies    Equipment Recommendations  None recommended by PT    Recommendations for Other Services       Precautions / Restrictions Precautions Precautions: Fall Restrictions Weight Bearing Restrictions: No LLE Weight Bearing: Weight bearing as tolerated      Mobility  Bed Mobility               General bed mobility comments: oob in recliner    Transfers Overall transfer level: Needs assistance Equipment used: Rolling walker (2 wheeled) Transfers: Sit to/from Stand Sit to Stand: Min guard         General transfer comment: Min guard for safety.  Ambulation/Gait Ambulation/Gait assistance: Min guard Gait Distance (Feet): 75 Feet Assistive device: Rolling walker (2 wheeled) Gait Pattern/deviations: Step-to pattern;Step-through pattern;Decreased stride length     General Gait Details: Progressed to step through pattern as distance increased. Min guard for safety. Pt denied lightheadedness. No LOB with RW use.  Stairs            Wheelchair Mobility    Modified Rankin (Stroke Patients Only)       Balance Overall balance assessment: Mild deficits observed, not formally tested                                           Pertinent Vitals/Pain Pain Assessment: 0-10 Pain Score: 5  Pain Location: L hip/thigh Pain Descriptors / Indicators: Sore Pain Intervention(s): Monitored during session;Ice applied    Home  Living Family/patient expects to be discharged to:: Private residence Living Arrangements: Children;Other (Comment) Available Help at Discharge: Family;Friend(s);Available PRN/intermittently Type of Home: House Home Access: Stairs to enter Entrance Stairs-Rails: None Entrance Stairs-Number of Steps: 2 Home Layout: One level Home Equipment: Bedside commode;Walker - 2 wheels Additional Comments: patient reports walker has been ordered    Prior Function Level of Independence: Independent               Hand Dominance        Extremity/Trunk Assessment   Upper Extremity Assessment Upper Extremity Assessment: Defer to OT evaluation    Lower Extremity Assessment Lower Extremity Assessment:  (post weakness 2* THA)    Cervical / Trunk Assessment Cervical / Trunk Assessment: Normal  Communication   Communication: No difficulties  Cognition Arousal/Alertness: Awake/alert Behavior During Therapy: WFL for tasks assessed/performed Overall Cognitive Status: Within Functional Limits for tasks assessed                                        General Comments      Exercises Total Joint Exercises Ankle Circles/Pumps: AROM;Both;10 reps Quad Sets: AROM;Both;10 reps Heel Slides: AAROM;Left;10 reps Hip ABduction/ADduction: AAROM;Left;10 reps   Assessment/Plan    PT Assessment Patient needs continued PT services  PT Problem List Decreased strength;Decreased range of  motion;Decreased mobility;Decreased activity tolerance;Decreased knowledge of use of DME;Decreased balance;Pain       PT Treatment Interventions DME instruction;Gait training;Therapeutic exercise;Balance training;Stair training;Functional mobility training;Therapeutic activities;Patient/family education    PT Goals (Current goals can be found in the Care Plan section)  Acute Rehab PT Goals Patient Stated Goal: get back to work PT Goal Formulation: With patient Time For Goal Achievement:  06/23/20 Potential to Achieve Goals: Good    Frequency 7X/week   Barriers to discharge        Co-evaluation               AM-PAC PT "6 Clicks" Mobility  Outcome Measure Help needed turning from your back to your side while in a flat bed without using bedrails?: A Little Help needed moving from lying on your back to sitting on the side of a flat bed without using bedrails?: A Little Help needed moving to and from a bed to a chair (including a wheelchair)?: A Little Help needed standing up from a chair using your arms (e.g., wheelchair or bedside chair)?: A Little Help needed to walk in hospital room?: A Little Help needed climbing 3-5 steps with a railing? : A Little 6 Click Score: 18    End of Session Equipment Utilized During Treatment: Gait belt Activity Tolerance: Patient tolerated treatment well Patient left: in bed;with call bell/phone within reach   PT Visit Diagnosis: Other abnormalities of gait and mobility (R26.89);Pain Pain - Right/Left: Left Pain - part of body: Hip    Time: 2683-4196 PT Time Calculation (min) (ACUTE ONLY): 23 min   Charges:   PT Evaluation $PT Eval Low Complexity: 1 Low PT Treatments $Gait Training: 8-22 mins         Faye Ramsay, PT Acute Rehabilitation  Office: [phone] Pager: [phone]

## 2020-06-09 NOTE — Plan of Care (Signed)
  Problem: Health Behavior/Discharge Planning: Goal: Ability to manage health-related needs will improve Outcome: Progressing   Problem: Activity: Goal: Risk for activity intolerance will decrease Outcome: Progressing   Problem: Elimination: Goal: Will not experience complications related to urinary retention Outcome: Progressing   Problem: Pain Managment: Goal: General experience of comfort will improve Outcome: Progressing

## 2020-11-04 ENCOUNTER — Ambulatory Visit
Admission: RE | Admit: 2020-11-04 | Discharge: 2020-11-04 | Disposition: A | Discharge status: Home / Self Care | Payer: BC Managed Care – PPO | Source: Ambulatory Visit | Attending: Family Medicine | Admitting: Family Medicine

## 2020-11-04 ENCOUNTER — Other Ambulatory Visit: Payer: Self-pay | Admitting: Family Medicine

## 2020-11-04 ENCOUNTER — Other Ambulatory Visit: Payer: Self-pay

## 2020-11-04 DIAGNOSIS — Z1231 Encounter for screening mammogram for malignant neoplasm of breast: Secondary | ICD-10-CM

## 2021-08-03 ENCOUNTER — Encounter (HOSPITAL_COMMUNITY): Payer: BC Managed Care – PPO

## 2021-08-03 ENCOUNTER — Other Ambulatory Visit (HOSPITAL_BASED_OUTPATIENT_CLINIC_OR_DEPARTMENT_OTHER): Payer: Self-pay

## 2021-08-03 ENCOUNTER — Emergency Department (HOSPITAL_BASED_OUTPATIENT_CLINIC_OR_DEPARTMENT_OTHER): Payer: BC Managed Care – PPO

## 2021-08-03 ENCOUNTER — Other Ambulatory Visit: Payer: Self-pay

## 2021-08-03 ENCOUNTER — Encounter (HOSPITAL_BASED_OUTPATIENT_CLINIC_OR_DEPARTMENT_OTHER): Payer: Self-pay

## 2021-08-03 ENCOUNTER — Emergency Department (HOSPITAL_BASED_OUTPATIENT_CLINIC_OR_DEPARTMENT_OTHER)
Admission: EM | Admit: 2021-08-03 | Discharge: 2021-08-03 | Disposition: A | Discharge status: Home / Self Care | Payer: BC Managed Care – PPO | Attending: Emergency Medicine | Admitting: Emergency Medicine

## 2021-08-03 ENCOUNTER — Other Ambulatory Visit (HOSPITAL_COMMUNITY): Payer: Self-pay | Admitting: *Deleted

## 2021-08-03 DIAGNOSIS — K0889 Other specified disorders of teeth and supporting structures: Secondary | ICD-10-CM | POA: Insufficient documentation

## 2021-08-03 DIAGNOSIS — M25561 Pain in right knee: Secondary | ICD-10-CM | POA: Diagnosis present

## 2021-08-03 DIAGNOSIS — Z79899 Other long term (current) drug therapy: Secondary | ICD-10-CM | POA: Diagnosis not present

## 2021-08-03 DIAGNOSIS — Z7982 Long term (current) use of aspirin: Secondary | ICD-10-CM | POA: Diagnosis not present

## 2021-08-03 DIAGNOSIS — M79604 Pain in right leg: Secondary | ICD-10-CM | POA: Diagnosis not present

## 2021-08-03 DIAGNOSIS — Z91040 Latex allergy status: Secondary | ICD-10-CM | POA: Diagnosis not present

## 2021-08-03 MED ORDER — ACETAMINOPHEN 500 MG PO TABS
500.0000 mg | ORAL_TABLET | Freq: Four times a day (QID) | ORAL | 0 refills | Status: AC | PRN
  Filled 2021-08-03: qty 50, 13d supply, fill #0

## 2021-08-03 MED ORDER — MELOXICAM 15 MG PO TABS
15.0000 mg | ORAL_TABLET | Freq: Every day | ORAL | 0 refills | Status: AC
  Filled 2021-08-03: qty 7, 7d supply, fill #0

## 2021-08-03 NOTE — ED Triage Notes (Signed)
Pt sent to ED from [facility] to rule out a DVT. Pt c/o pain behind her right knee and calf for about 1 week. Pt denies cp or sob.

## 2021-08-03 NOTE — ED Notes (Signed)
Pt discharged home after verbalizing understanding of discharge instructions; nad noted. 

## 2021-08-03 NOTE — Discharge Instructions (Addendum)
Your imaging studies were negative for DVT.  Please continue taking your 81 mg aspirin until you are able to follow-up with orthopedics.  The contact information for local orthopedic by the name of Dr. Blanchie Dessert has been provided for you.  Please call and schedule appointment to follow-up with the next 3 to 4 days for reevaluation and continued medical management.  Return to the ED for new or worsening symptoms as discussed.

## 2021-08-03 NOTE — ED Notes (Signed)
Patient transported to Ultrasound 

## 2021-08-03 NOTE — ED Provider Notes (Signed)
[facility] EMERGENCY DEPT Provider Note   CSN: [id number] Arrival date & time: 08/03/21  1215     History  Chief Complaint  Patient presents with   Leg Pain    Crystal Ramirez is a 61 y.o. female with chief complaint of 1 week of right leg pain.  Pain was a sudden onset with no specific stressor.  Pain located directly behind the right knee closer to the top of the calf.  Pain worsened with palpation, described as dull like a "toothache", and occasionally radiates down her entire leg.  Denies shortness of breath, chest pain, lightheadedness, syncope, fevers.  No recent infections, history of gout, or new medications.  Denies warmth or redness of her knee.  Hx of meniscectomy several years ago and a left THA last year.  Not on anticoagulation.  The history is provided by the patient and medical records.  Leg Pain      Home Medications Prior to Admission medications   Medication Sig Start Date End Date Taking? Authorizing Provider  acetaminophen (TYLENOL) 500 MG tablet Take 1 tablet (500 mg total) by mouth every 6 (six) hours as needed for mild pain or moderate pain. [id number]   Prince Rome, PA-C  amLODipine (NORVASC) 5 MG tablet Take 5 mg by mouth daily.    [provider]  aspirin EC 81 MG tablet Take 1 tablet (81 mg total) by mouth 2 (two) times daily. For DVT prophylaxis for 30 days after surgery. 06/09/20   Britt Bottom, PA-C  cetirizine (ZYRTEC) 10 MG tablet Take 10 mg by mouth daily as needed for allergies.    [provider]  fluticasone (FLONASE) 50 MCG/ACT nasal spray Place 1 spray into both nostrils daily as needed for allergies. 05/07/20   [provider]  HYDROcodone-acetaminophen (NORCO) 10-325 MG tablet Take 1 tablet by mouth every 6 (six) hours as needed for severe pain. 06/09/20   Britt Bottom, PA-C  ketotifen (ZADITOR) 0.025 % ophthalmic solution Place 1 drop into both eyes daily.    [provider]   losartan-hydrochlorothiazide (HYZAAR) 100-25 MG tablet Take 1 tablet by mouth daily.    [provider]  meloxicam (MOBIC) 15 MG tablet Take 1 tablet (15 mg total) by mouth daily for 7 days. 08/03/21 [id number]  Prince Rome, PA-C  methocarbamol (ROBAXIN) 500 MG tablet Take 1 tablet (500 mg total) by mouth every 8 (eight) hours as needed for muscle spasms. 06/09/20   Britt Bottom, PA-C  metoprolol succinate (TOPROL-XL) 100 MG 24 hr tablet Take 100 mg by mouth daily. Take with or immediately following a meal.    [provider]  Multiple Vitamin (MULTIVITAMIN WITH MINERALS) TABS tablet Take 1 tablet by mouth daily.    [provider]  omeprazole (PRILOSEC OTC) 20 MG tablet Take 1 tablet (20 mg total) by mouth daily. For gastric protection 06/09/20 07/09/20  Aggie Moats M, PA-C  ondansetron (ZOFRAN) 4 MG tablet Take 1 tablet (4 mg total) by mouth daily as needed for nausea or vomiting. 06/09/20   Britt Bottom, PA-C  potassium chloride (KLOR-CON) 10 MEQ tablet Take 10 mEq by mouth daily.    [provider]      Allergies    Latex, Ibuprofen, and Morphine and related    Review of Systems   Review of Systems  Musculoskeletal:        Right knee/upper calf pain    Physical Exam Updated Vital Signs BP 127/80 (BP  Location: Right Arm)   Pulse 62   Temp 98.4 F (36.9 C) (Oral)   Resp 18   Ht 5\' 6"  (1.676 m)   Wt 120.2 kg   SpO2 96%   BMI 42.77 kg/m  Physical Exam Vitals and nursing note reviewed.  Constitutional:      General: She is not in acute distress.    Appearance: She is well-developed. She is not ill-appearing or diaphoretic.  HENT:     Head: Normocephalic and atraumatic.  Eyes:     Conjunctiva/sclera: Conjunctivae normal.  Cardiovascular:     Rate and Rhythm: Normal rate and regular rhythm.     Pulses: Normal pulses.     Heart sounds: No murmur heard.    Comments: 2+ DP and PT pulses bilaterally.  No significant unilateral edema,  erythema, or warmth of lower extremity joints.  HR 60-70 bpm. Pulmonary:     Effort: Pulmonary effort is normal. No respiratory distress.     Breath sounds: Normal breath sounds. No decreased breath sounds, wheezing or rales.     Comments: Lungs CTAB.  Able to communicate without difficulty or increased respiratory effort.   Chest:     Chest wall: No tenderness.  Abdominal:     General: Bowel sounds are normal.     Palpations: Abdomen is soft.     Tenderness: There is no abdominal tenderness.  Musculoskeletal:        General: No swelling.     Cervical back: Neck supple.     Right lower leg: No edema.     Left lower leg: Tenderness (Very mild) present. No edema.       Legs:     Comments: Very mild tenderness of the right posterior knee joint and small portion of proximal right calf, without erythema, significant edema, or unilateral diameter differences.  Lower extremities with full ROM and strength preserved.  No evidence of erythema, laceration, deformity, malalignment, bony tenderness, warmth, pallor, ecchymosis, bruising, wound, or rash.  Skin:    General: Skin is warm and dry.     Capillary Refill: Capillary refill takes less than 2 seconds.     Findings: No bruising, ecchymosis or erythema.  Neurological:     Mental Status: She is alert and oriented to person, place, and time.  Psychiatric:        Mood and Affect: Mood normal.     ED Results / Procedures / Treatments   Labs (all labs ordered are listed, but only abnormal results are displayed) Labs Reviewed - No data to display  EKG None  Radiology US Venous Img Lower Unilateral Right (DVT)  Result Date: 08/03/2021 CLINICAL DATA:  Right knee pain. EXAM: RIGHT LOWER EXTREMITY VENOUS DOPPLER ULTRASOUND TECHNIQUE: Gray-scale sonography with graded compression, as well as color Doppler and duplex ultrasound were performed to evaluate the lower extremity deep venous systems from the level of the common femoral vein and  including the common femoral, femoral, profunda femoral, popliteal and calf veins including the posterior tibial, peroneal and gastrocnemius veins when visible. The superficial great saphenous vein was also interrogated. Spectral Doppler was utilized to evaluate flow at rest and with distal augmentation maneuvers in the common femoral, femoral and popliteal veins. COMPARISON:  None Available. FINDINGS: Contralateral Common Femoral Vein: Respiratory phasicity is normal and symmetric with the symptomatic side. No evidence of thrombus. Normal compressibility. Common Femoral Vein: No evidence of thrombus. Normal compressibility, respiratory phasicity and response to augmentation. Saphenofemoral Junction: No evidence of thrombus. Normal compressibility  and flow on color Doppler imaging. Profunda Femoral Vein: No evidence of thrombus. Normal compressibility and flow on color Doppler imaging. Femoral Vein: No evidence of thrombus. Normal compressibility, respiratory phasicity and response to augmentation. Popliteal Vein: No evidence of thrombus. Normal compressibility, respiratory phasicity and response to augmentation. Calf Veins: No evidence of thrombus. Normal compressibility and flow on color Doppler imaging. Superficial Great Saphenous Vein: No evidence of thrombus. Normal compressibility. Venous Reflux:  None. Other Findings: No evidence of superficial thrombophlebitis or abnormal fluid collection. IMPRESSION: No evidence of right lower extremity deep venous thrombosis. Electronically Signed   By: Aletta Edouard M.D.   On: 08/03/2021 14:24    Procedures Procedures    Medications Ordered in ED Medications - No data to display  ED Course/ Medical Decision Making/ A&P                           Medical Decision Making Amount and/or Complexity of Data Reviewed External Data Reviewed: notes. Labs: ordered. Decision-making details documented in ED Course. Radiology: ordered and independent interpretation  performed. Decision-making details documented in ED Course. ECG/medicine tests: ordered and independent interpretation performed. Decision-making details documented in ED Course.  Risk OTC drugs. Prescription drug management.   61 y.o. female presents to the ED for concern of Leg Pain   This involves an extensive number of treatment options, and is a complaint that carries with it a high risk of complications and morbidity.  The emergent differential diagnosis prior to evaluation includes, but is not limited to: DVT, cellulitis, Baker's cyst, abscess, muscle strain, arthritis  This is not an exhaustive differential.   Past Medical History / Co-morbidities / Social History: Hx of HTN, arthritis, prior hysterectomy, prior left THA, prior right knee meniscectomy.   Additional History:  Internal and external records from outside source obtained and reviewed including orthopedics, surgery, vascular surgery  Physical Exam: Physical exam performed. The pertinent findings include: Mild tenderness of the right posterior knee joint and small portion of proximal right calf, without erythema, significant edema, or unilateral diameter differences.  Lab Tests: None  Imaging Studies: I ordered imaging studies including US Venous Doppler of right lower extremity .  I independently visualized and interpreted said imaging.  Pertinent results include: Negative for DVT  I agree with the radiologist interpretation.  Medications: I have reviewed the patients home medicines and have made adjustments as needed  ED Course/Disposition: Pt well-appearing on exam.  Mild tenderness of posterior right knee with small portion of proximal right calf, without erythema, significant edema, or unilateral diameter differences.  Pt spends most of her day at work on her feet.  No direct/blunt injury.  Alignment appears to be intact and without leg shortening.  ROM preserved, strength adequate, and lower extremities  appear neurovascularly intact.  Ultrasound imaging negative for DVT.  No evidence of shortness of breath, tachycardia, or increased respiratory effort.  Not suspicious of pulmonary embolism.  Physical exam and history not suggestive of cellulitis, DVT, septic arthritis, gout, reactive arthritis, ischemia, or patellar fracture/dislocation.  Physical exam overall benign.  Overall, I am uncertain the exact etiology of the patient's symptoms.  However, I do not believe she is currently experiencing a medical, surgical, or psychiatric emergency.  May be due to benign cause such as osteoarthritis, baker's cyst, or soft tissue strain.  Recommend close follow up with orthopedics and continued conservative treatment management.  Recommend continuing use of her Meloxicam and Tylenol.  Offered  pt knee sleeve or ace wrap for support, and crutches.  Pt elected to proceed with ace wrap.  Pt in NAD and good condition at time of discharge.    After consideration of the diagnostic results and the patient's encounter today, I feel that the emergency department workup does not suggest an emergent condition requiring admission or immediate intervention beyond what has been performed at this time.  The patient is safe for discharge and has been instructed to return immediately for worsening symptoms, change in symptoms or any other concerns.  Discussed course of treatment thoroughly with the patient, whom demonstrated understanding.  Patient in agreement and has no further questions.  I discussed this case with my attending physician Dr. Zenia Resides, who agreed with the proposed treatment course and cosigned this note including patient's presenting symptoms, physical exam, and planned diagnostics and interventions.  Attending physician stated agreement with plan or made changes to plan which were implemented.     This chart was dictated using voice recognition software.  Despite best efforts to proofread, errors can occur which can  change the documentation meaning.         Final Clinical Impression(s) / ED Diagnoses Final diagnoses:  Acute pain of right knee    Rx / DC Orders ED Discharge Orders          Ordered    acetaminophen (TYLENOL) 500 MG tablet  Every 6 hours PRN        08/03/21 1509    meloxicam (MOBIC) 15 MG tablet  Daily        08/03/21 1509              Prince Rome, PA-C [id number]    Lacretia Leigh, MD 08/09/21 1621

## 2021-10-27 ENCOUNTER — Other Ambulatory Visit: Payer: Self-pay | Admitting: Family Medicine

## 2021-10-27 DIAGNOSIS — Z1231 Encounter for screening mammogram for malignant neoplasm of breast: Secondary | ICD-10-CM

## 2021-11-10 ENCOUNTER — Ambulatory Visit: Payer: BC Managed Care – PPO

## 2021-12-02 ENCOUNTER — Ambulatory Visit
Admission: RE | Admit: 2021-12-02 | Discharge: 2021-12-02 | Disposition: A | Discharge status: Home / Self Care | Payer: BC Managed Care – PPO | Source: Ambulatory Visit | Attending: Family Medicine | Admitting: Family Medicine

## 2021-12-02 DIAGNOSIS — Z1231 Encounter for screening mammogram for malignant neoplasm of breast: Secondary | ICD-10-CM

## 2023-01-15 ENCOUNTER — Other Ambulatory Visit: Payer: Self-pay | Admitting: Family Medicine

## 2023-01-15 DIAGNOSIS — Z1231 Encounter for screening mammogram for malignant neoplasm of breast: Secondary | ICD-10-CM

## 2023-02-14 ENCOUNTER — Ambulatory Visit
Admission: RE | Admit: 2023-02-14 | Discharge: 2023-02-14 | Disposition: A | Discharge status: Home / Self Care | Payer: BC Managed Care – PPO | Source: Ambulatory Visit | Attending: Family Medicine | Admitting: Family Medicine

## 2023-02-14 DIAGNOSIS — Z1231 Encounter for screening mammogram for malignant neoplasm of breast: Secondary | ICD-10-CM

## 2023-08-31 IMAGING — MG MM DIGITAL SCREENING BILAT W/ TOMO AND CAD
8 series · 8 of 24 positions shown · non-contrast
Comparison: Previous exam(s).

CLINICAL DATA: Screening.

EXAM:
DIGITAL SCREENING BILATERAL MAMMOGRAM WITH TOMOSYNTHESIS AND CAD
TECHNIQUE: Bilateral screening digital craniocaudal and mediolateral oblique
mammograms were obtained. Bilateral screening digital breast
tomosynthesis was performed. The images were evaluated with
computer-aided detection.

[L MLO synth-2D]
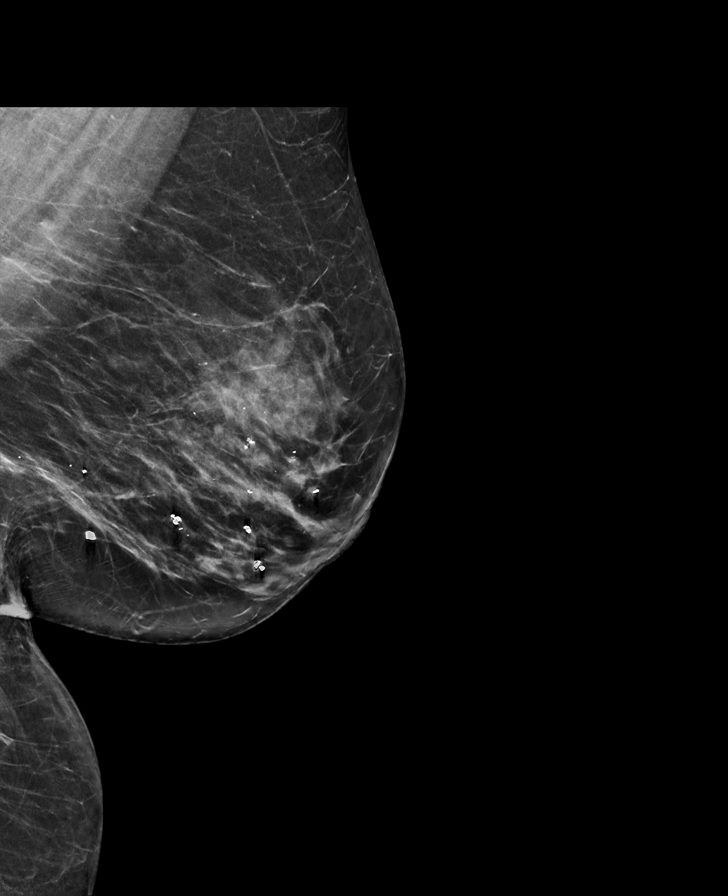

[L CC synth-2D]
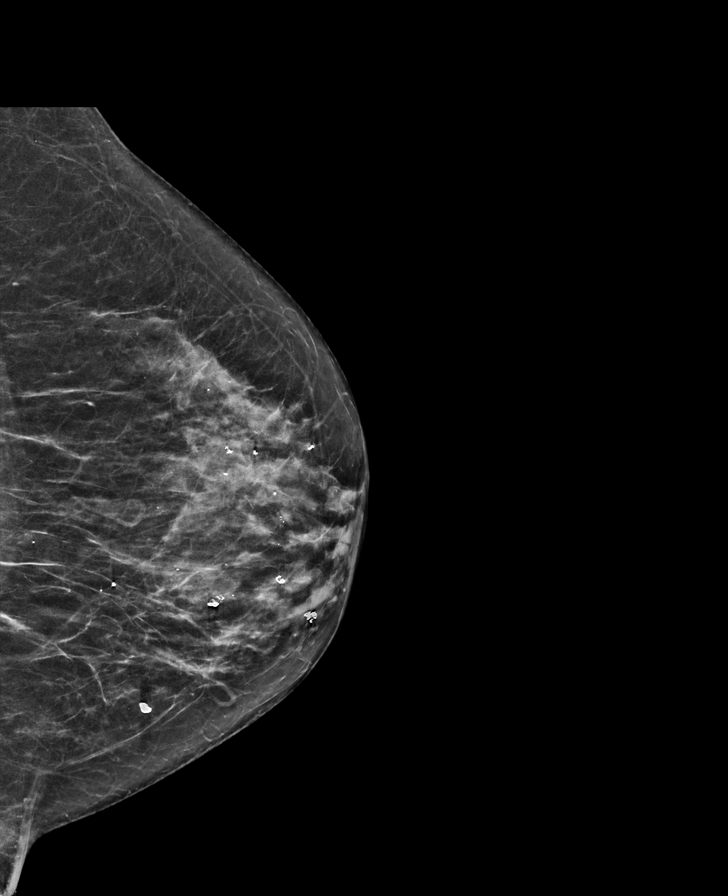

[R MLO synth-2D]
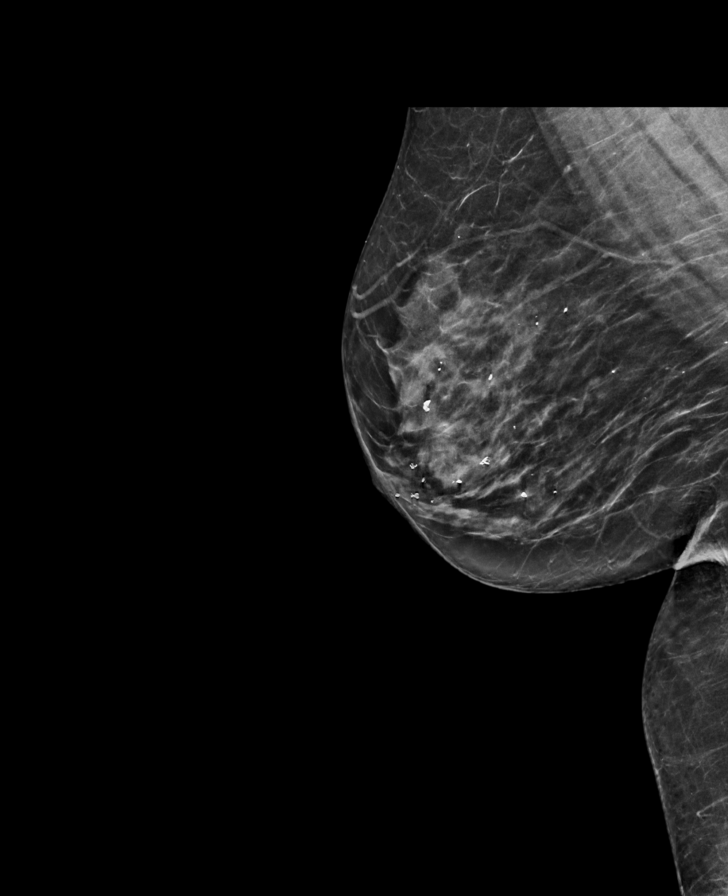

[R CC synth-2D]
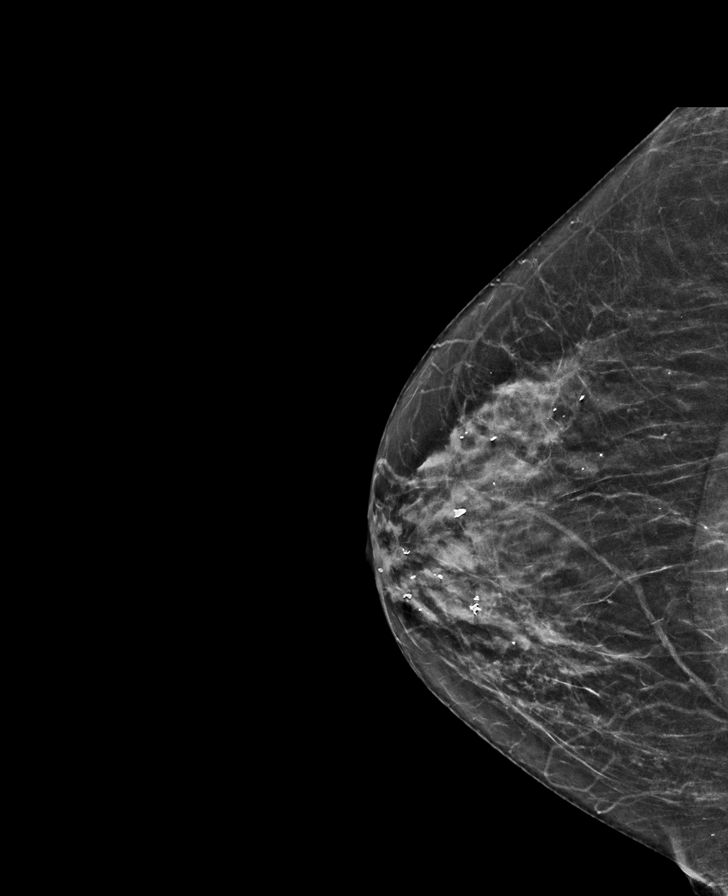

[L CC tomo · tomo slice 31/60.0]
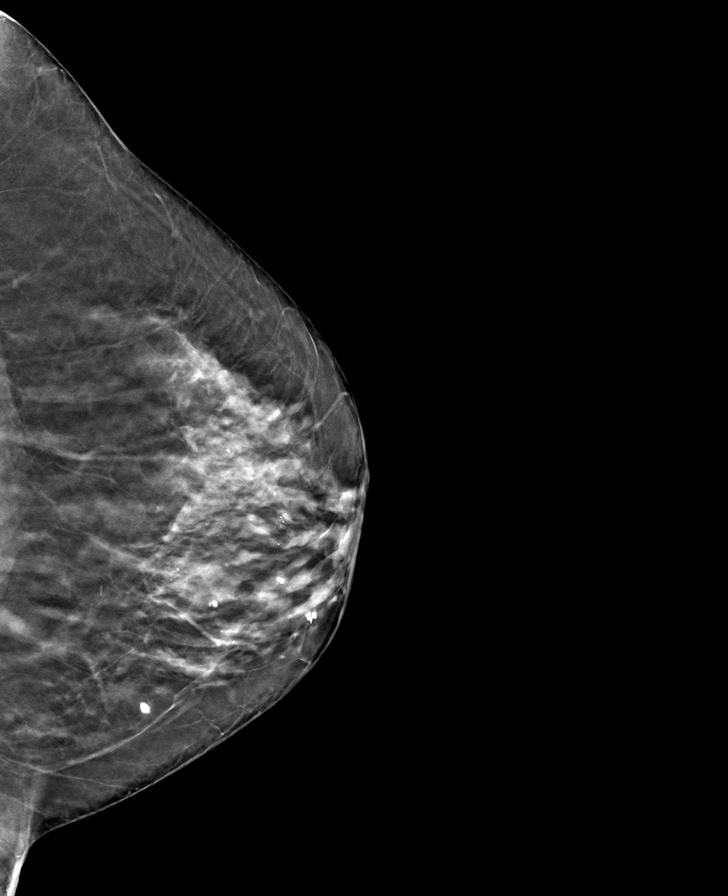

[R MLO tomo · tomo slice 32/63.0]
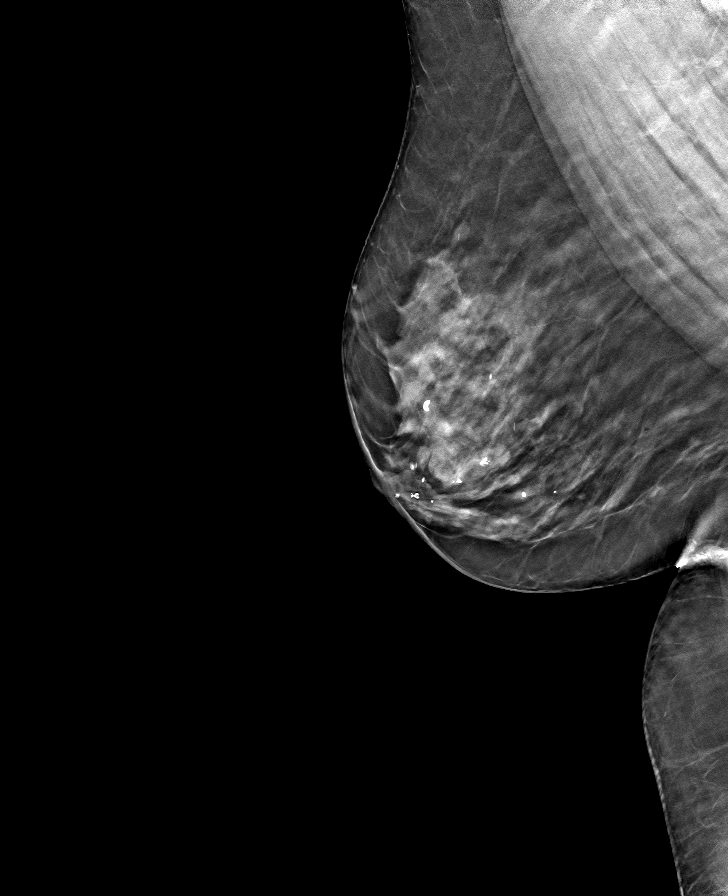

[L MLO tomo · tomo slice 35/70.0]
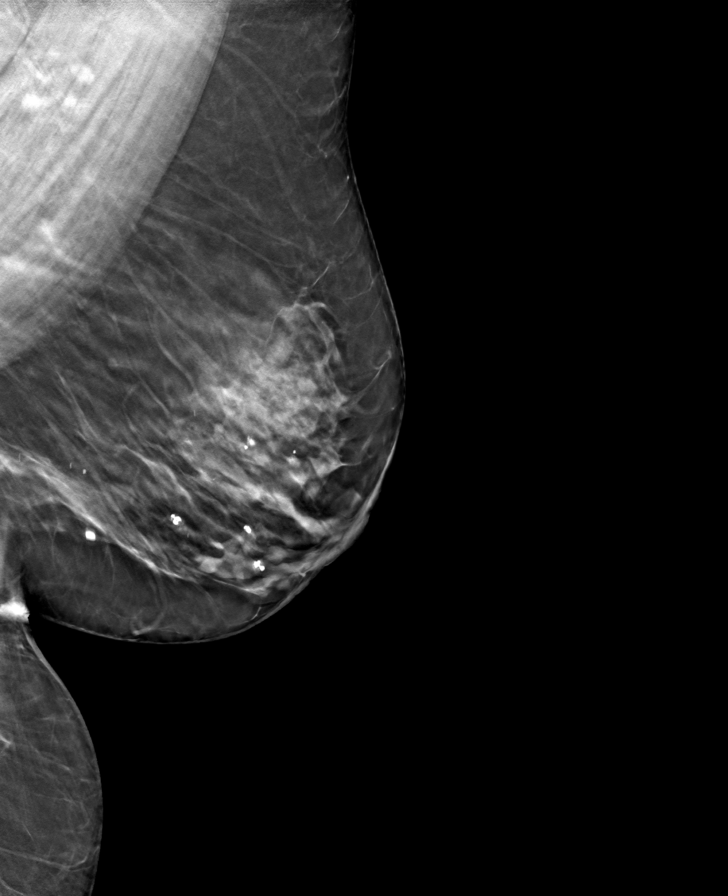

[R CC tomo · tomo slice 29/58.0]
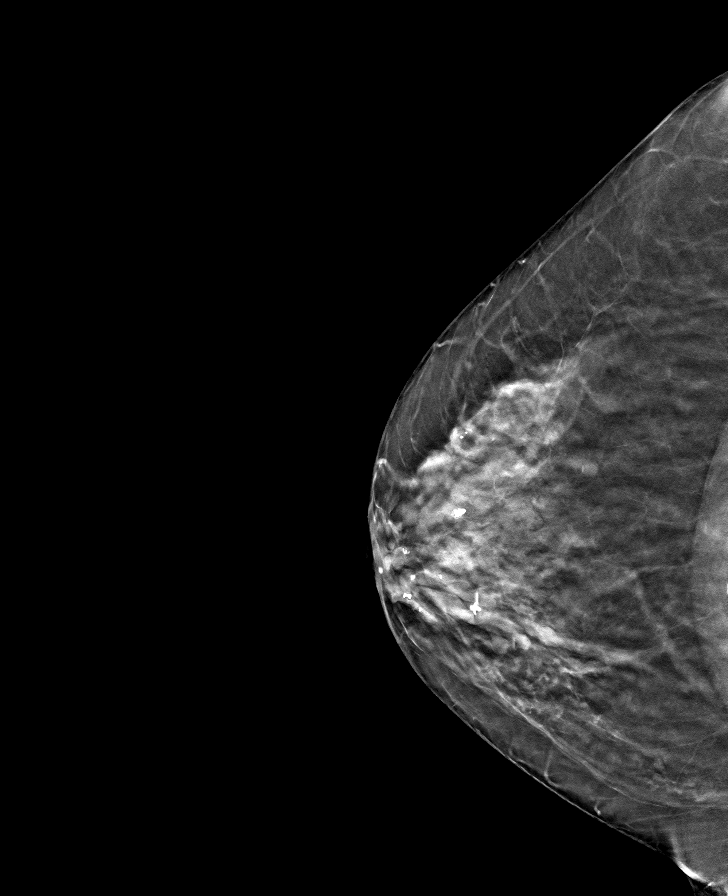

[8 of 24 positions shown; findings below may reference images not displayed]

ACR Breast Density Category c: The breast tissue is heterogeneously
dense, which may obscure small masses.
FINDINGS: There are no findings suspicious for malignancy.
IMPRESSION: No mammographic evidence of malignancy. A result letter of this
screening mammogram will be mailed directly to the patient.

RECOMMENDATION:
Screening mammogram in one year. (Code:Q3-W-BC3)

BI-RADS CATEGORY  1: Negative.

## 2024-02-12 ENCOUNTER — Other Ambulatory Visit: Payer: Self-pay | Admitting: Family Medicine

## 2024-02-12 DIAGNOSIS — Z1231 Encounter for screening mammogram for malignant neoplasm of breast: Secondary | ICD-10-CM

## 2024-03-06 ENCOUNTER — Ambulatory Visit: Payer: Self-pay

## 2024-03-11 ENCOUNTER — Ambulatory Visit
Admission: RE | Admit: 2024-03-11 | Discharge: 2024-03-11 | Disposition: A | Payer: Self-pay | Source: Ambulatory Visit | Attending: Family Medicine | Admitting: Family Medicine

## 2024-03-11 DIAGNOSIS — Z1231 Encounter for screening mammogram for malignant neoplasm of breast: Secondary | ICD-10-CM

## 2024-05-29 IMAGING — US US EXTREM LOW VENOUS*R*
1 series · 13 of 24 positions shown · non-contrast
Comparison: None Available.

CLINICAL DATA: Right knee pain.



[Series 1: us venous img lower uni right (dvt) · portal-venous · 13 of 45 slices shown]
[im 1/45]
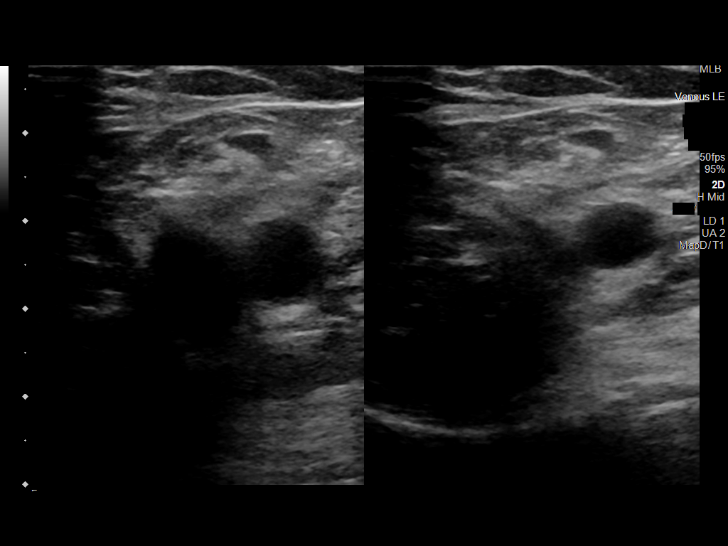
[im 4/45]
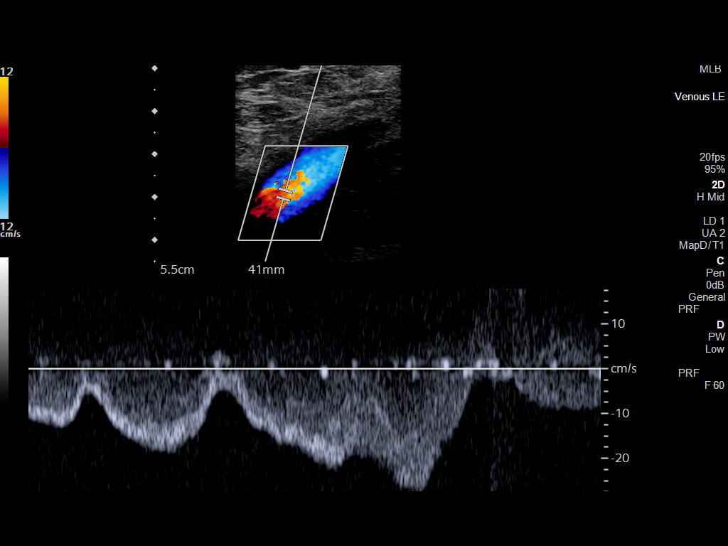
[im 8/45]
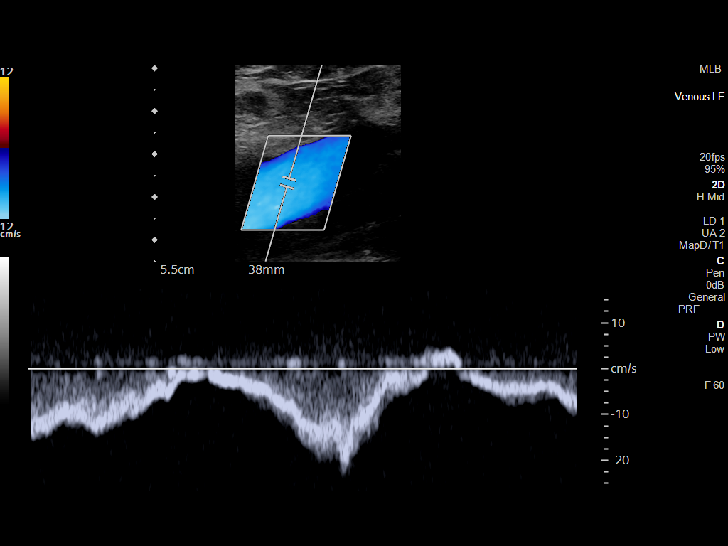
[im 12/45]
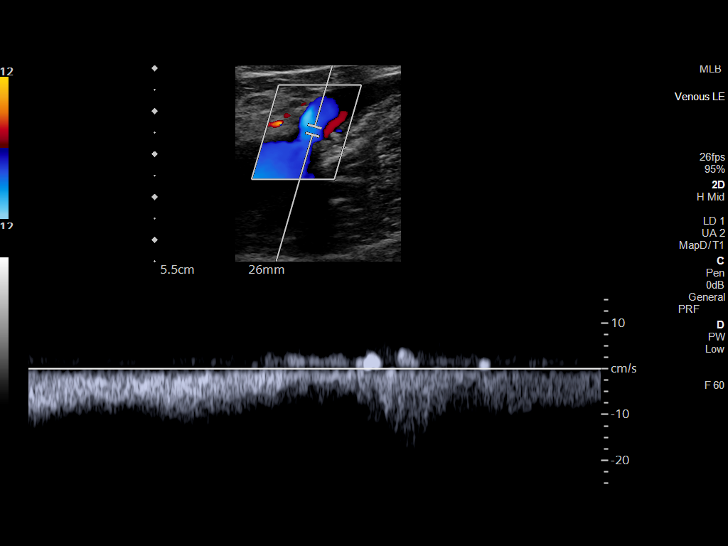
[im 16/45]
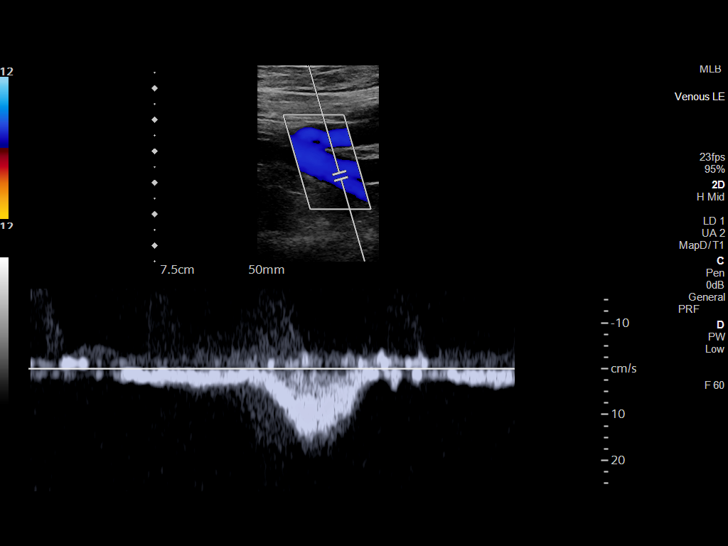
[im 20/45]
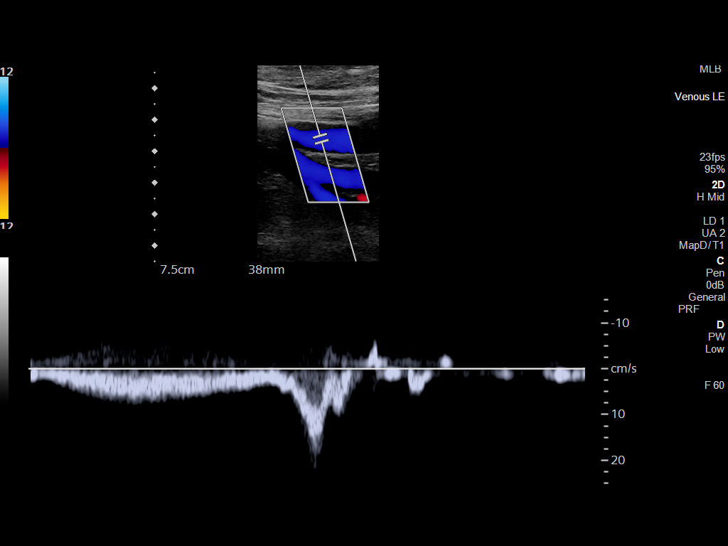
[im 23/45]
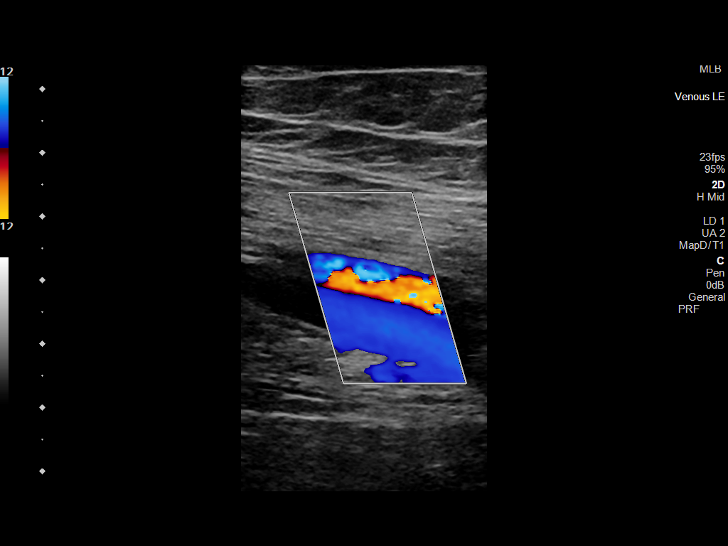
[im 25/45]
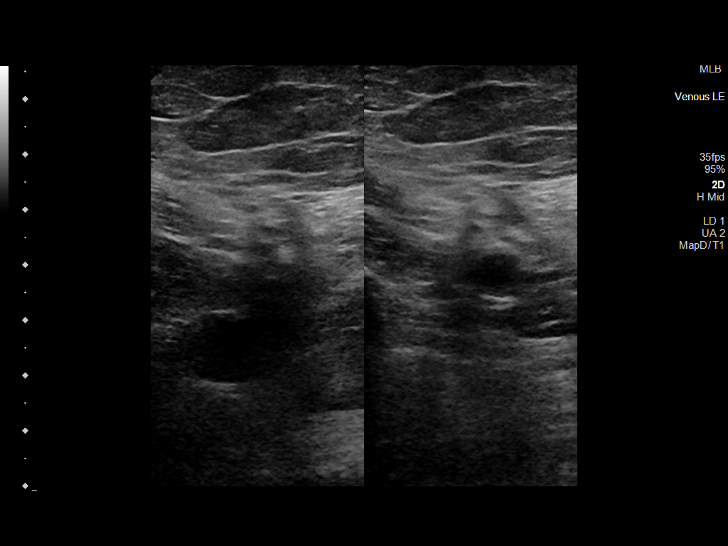
[im 29/45]
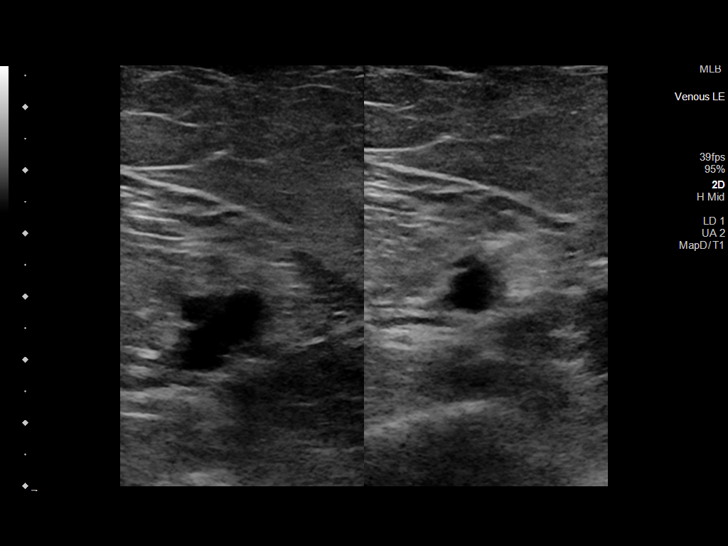
[im 33/45]
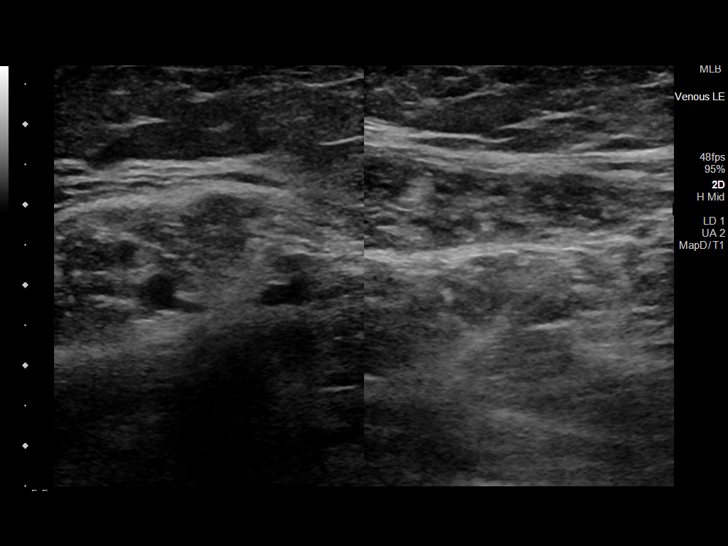
[im 37/45]
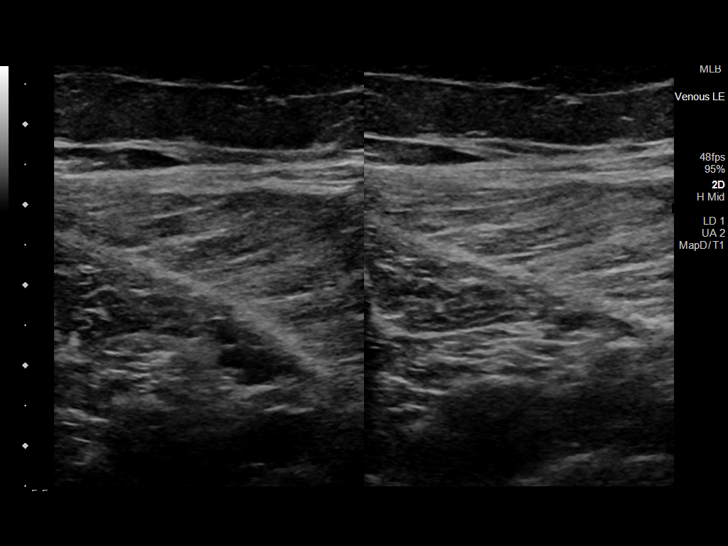
[im 41/45]
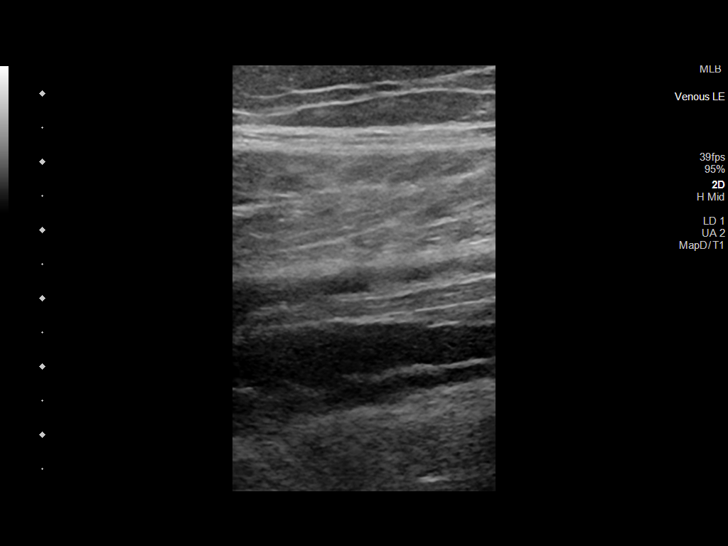
[im 45/45]
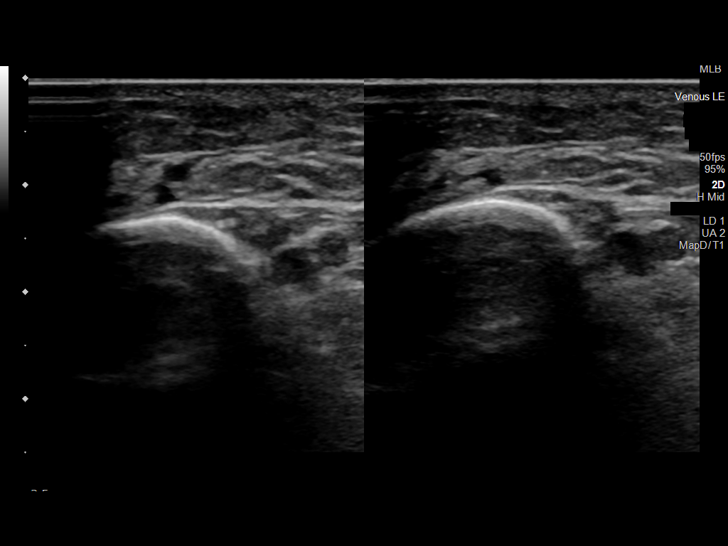

[13 of 24 positions shown; findings below may reference images not displayed]

FINDINGS: Contralateral Common Femoral Vein: Respiratory phasicity is normal
and symmetric with the symptomatic side. No evidence of thrombus.
Normal compressibility.

Common Femoral Vein: No evidence of thrombus. Normal
compressibility, respiratory phasicity and response to augmentation.

Saphenofemoral Junction: No evidence of thrombus. Normal
compressibility and flow on color Doppler imaging.

Profunda Femoral Vein: No evidence of thrombus. Normal
compressibility and flow on color Doppler imaging.

Femoral Vein: No evidence of thrombus. Normal compressibility,
respiratory phasicity and response to augmentation.

Popliteal Vein: No evidence of thrombus. Normal compressibility,
respiratory phasicity and response to augmentation.

Calf Veins: No evidence of thrombus. Normal compressibility and flow
on color Doppler imaging.

Superficial Great Saphenous Vein: No evidence of thrombus. Normal
compressibility.

Venous Reflux:  None.

Other Findings: No evidence of superficial thrombophlebitis or
abnormal fluid collection.
IMPRESSION: No evidence of right lower extremity deep venous thrombosis.
